# Patient Record
Sex: Male | Born: 1985
Health system: Southern US, Community
[De-identification: ages and names within clinical notes are randomized; demographics above are authoritative.]

## PROBLEM LIST (undated history)

## (undated) DIAGNOSIS — H409 Unspecified glaucoma: Secondary | ICD-10-CM

## (undated) DIAGNOSIS — I10 Essential (primary) hypertension: Secondary | ICD-10-CM

## (undated) DIAGNOSIS — F909 Attention-deficit hyperactivity disorder, unspecified type: Secondary | ICD-10-CM

## (undated) HISTORY — DX: Attention-deficit hyperactivity disorder, unspecified type: F90.9

## (undated) HISTORY — DX: Unspecified glaucoma: H40.9

## (undated) HISTORY — DX: Essential (primary) hypertension: I10

---
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
## 2012-10-12 ENCOUNTER — Encounter: Payer: Self-pay | Admitting: Family Medicine

## 2012-10-12 ENCOUNTER — Ambulatory Visit (INDEPENDENT_AMBULATORY_CARE_PROVIDER_SITE_OTHER): Payer: 59 | Admitting: Family Medicine

## 2012-10-12 VITALS — BP 104/70 | Temp 98.6°F | Wt 160.0 lb

## 2012-10-12 DIAGNOSIS — Z7689 Persons encountering health services in other specified circumstances: Secondary | ICD-10-CM

## 2012-10-12 DIAGNOSIS — Z23 Encounter for immunization: Secondary | ICD-10-CM

## 2012-10-12 DIAGNOSIS — Z7189 Other specified counseling: Secondary | ICD-10-CM

## 2012-10-12 DIAGNOSIS — Z0000 Encounter for general adult medical examination without abnormal findings: Secondary | ICD-10-CM

## 2012-10-12 DIAGNOSIS — F909 Attention-deficit hyperactivity disorder, unspecified type: Secondary | ICD-10-CM | POA: Insufficient documentation

## 2012-10-12 LAB — LIPID PANEL
LDL Cholesterol: 62 mg/dL (ref 0–99)
Total CHOL/HDL Ratio: 3
VLDL: 26.4 mg/dL (ref 0.0–40.0)

## 2012-10-12 MED ORDER — LISDEXAMFETAMINE DIMESYLATE 30 MG PO CAPS: 30.0000 mg | ORAL_CAPSULE | ORAL | Status: DC

## 2012-10-12 NOTE — Progress Notes (Signed)
Chief Complaint  Patient presents with  . Establish Care    HPI:  Luke Estrada is here to establish care. Needs physical for work and vaccines as has a new baby on the way. Needs doc for ADD management. Had lost several jobs related to poor focus then had formal dx ADD in late 20s. On ADD medication for some time - stable.  Has the following chronic problems and concerns today:  Patient Active Problem List   Diagnosis Date Noted  . ADHD (attention deficit hyperactivity disorder) 10/12/2012   Health Maintenance: -needs tdap and flu vaccine  ROS: See pertinent positives and negatives per HPI.  History reviewed. No pertinent past medical history.  History reviewed. No pertinent family history.  History   Social History  . Marital Status: Married    Spouse Name: N/A    Number of Children: N/A  . Years of Education: N/A   Social History Main Topics  . Smoking status: Never Smoker   . Smokeless tobacco: None  . Alcohol Use: Yes     Comment: under safe drinking levels  . Drug Use: None  . Sexual Activity: None   Other Topics Concern  . 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  GENERAL: vitals reviewed and listed above, alert, oriented, appears well hydrated and in no acute distress  HEENT: atraumatic, conjunttiva clear, no obvious abnormalities on inspection of external nose and ears  NECK: no obvious masses on inspection  LUNGS: clear to auscultation bilaterally, no wheezes, rales or rhonchi, good air  movement  CV: HRRR, no peripheral edema  MS: moves all extremities without noticeable abnormality  PSYCH: pleasant and cooperative, no obvious depression or anxiety  ASSESSMENT AND PLAN:  Discussed the following assessment and plan:  ADHD (attention deficit hyperactivity disorder) - Plan: lisdexamfetamine (VYVANSE) 30 MG capsule -refilled for one month to get to psych appt -advised do not do long term management of controlled substances in adults and will not provide further refills -brochure given for psychiatrist in town -advised regular exercise, healthy diet and counseling  Visit for preventive health examination - Plan: Lipid Panel, Hemoglobin A1c  Encounter to establish care -We reviewed the PMH, PSH, FH, SH, Meds and Allergies. -We provided refills for any medications we will prescribe as needed. -We addressed current concerns per orders and patient instructions. -We have asked for records for pertinent exams, studies, vaccines and notes from previous providers. -We have advised patient to follow up per instructions below. -reviewed level A and B USPSTF recs -tdap and flu vaccines given    -Patient advised to return or notify a doctor immediately if symptoms worsen or persist or new concerns arise.  Patient Instructions  -We have ordered labs or studies at this visit. It can take up to 1-2 weeks for results and processing. We will contact you with instructions IF your results are abnormal. Normal results  will be released to your [facility]. If you have not heard from [place] or can not find your results in [facility] in 2 weeks please contact our office.  -PLEASE SIGN UP FOR MYCHART TODAY   We recommend the following healthy lifestyle measures: - eat a healthy diet consisting of lots of vegetables, fruits, beans, nuts, seeds, healthy meats such as white chicken and fish and whole grains.  - avoid fried foods, fast food, processed foods, sodas, red meet and other fattening foods.   - get a least 150 minutes of aerobic exercise per week.   We refilled your ADD medication for 1 month - we do not do long term management of adult ADD and will not refill this medication after today.  Follow up in: 1 year or as needed      [name], [name].

## 2012-10-12 NOTE — Patient Instructions (Signed)
-  We have ordered labs or studies at this visit. It can take up to 1-2 weeks for results and processing. We will contact you with instructions IF your results are abnormal. Normal results will be released to your [facility]. If you have not heard from [place] or can not find your results in [facility] in 2 weeks please contact our office.  -PLEASE SIGN UP FOR MYCHART TODAY   We recommend the following healthy lifestyle measures: - eat a healthy diet consisting of lots of vegetables, fruits, beans, nuts, seeds, healthy meats such as white chicken and fish and whole grains.  - avoid fried foods, fast food, processed foods, sodas, red meet and other fattening foods.  - get a least 150 minutes of aerobic exercise per week.   We refilled your ADD medication for 1 month - we do not do long term management of adult ADD and will not refill this medication after today.  Follow up in: 1 year or as needed

## 2012-10-12 NOTE — Addendum Note (Signed)
Addended by: Azucena Freed on: 10/12/2012 01:32 PM   Modules accepted: Orders

## 2012-10-12 NOTE — Progress Notes (Signed)
Quick Note:  Called and spoke with pt and pt is aware. ______ 

## 2012-10-27 ENCOUNTER — Encounter: Payer: Self-pay | Admitting: Family Medicine

## 2012-11-12 ENCOUNTER — Encounter: Payer: Self-pay | Admitting: Family Medicine

## 2012-11-12 NOTE — Progress Notes (Signed)
Received a few OV notes from prior physician Dr. Darnelle Bos. Placed in scan box.

## 2017-01-10 ENCOUNTER — Encounter (HOSPITAL_COMMUNITY): Payer: Self-pay | Admitting: *Deleted

## 2017-01-10 ENCOUNTER — Other Ambulatory Visit: Payer: Self-pay

## 2017-01-10 ENCOUNTER — Emergency Department (HOSPITAL_COMMUNITY): Payer: 59

## 2017-01-10 ENCOUNTER — Emergency Department (HOSPITAL_COMMUNITY)
Admission: EM | Admit: 2017-01-10 | Discharge: 2017-01-11 | Disposition: A | Discharge status: Home / Self Care | Payer: 59 | Attending: Emergency Medicine | Admitting: Emergency Medicine

## 2017-01-10 DIAGNOSIS — Z79899 Other long term (current) drug therapy: Secondary | ICD-10-CM | POA: Insufficient documentation

## 2017-01-10 DIAGNOSIS — R1011 Right upper quadrant pain: Secondary | ICD-10-CM | POA: Insufficient documentation

## 2017-01-10 DIAGNOSIS — K8050 Calculus of bile duct without cholangitis or cholecystitis without obstruction: Secondary | ICD-10-CM

## 2017-01-10 LAB — COMPREHENSIVE METABOLIC PANEL
ALBUMIN: 5 g/dL (ref 3.5–5.0)
ALK PHOS: 75 U/L (ref 38–126)
ALT: 22 U/L (ref 17–63)
AST: 43 U/L — AB (ref 15–41)
Anion gap: 10 (ref 5–15)
BILIRUBIN TOTAL: 1 mg/dL (ref 0.3–1.2)
BUN: 10 mg/dL (ref 6–20)
CO2: 25 mmol/L (ref 22–32)
CREATININE: 0.89 mg/dL (ref 0.61–1.24)
Calcium: 8.9 mg/dL (ref 8.9–10.3)
Chloride: 103 mmol/L (ref 101–111)
GFR calc Af Amer: >60 mL/min (ref >60)
GLUCOSE: 122 mg/dL — AB (ref 65–99)
POTASSIUM: 3.8 mmol/L (ref 3.5–5.1)
Sodium: 138 mmol/L (ref 135–145)
TOTAL PROTEIN: 7.3 g/dL (ref 6.5–8.1)

## 2017-01-10 LAB — CBC
HEMATOCRIT: 44.1 % (ref 39.0–52.0)
Hemoglobin: 16.1 g/dL (ref 13.0–17.0)
MCH: 33.1 pg (ref 26.0–34.0)
MCHC: 36.5 g/dL — AB (ref 30.0–36.0)
MCV: 90.7 fL (ref 78.0–100.0)
PLATELETS: 276 10*3/uL (ref 150–400)
RBC: 4.86 MIL/uL (ref 4.22–5.81)
RDW: 12.9 % (ref 11.5–15.5)
WBC: 7.7 10*3/uL (ref 4.0–10.5)

## 2017-01-10 LAB — URINALYSIS, ROUTINE W REFLEX MICROSCOPIC
BILIRUBIN URINE: NEGATIVE
Glucose, UA: 150 mg/dL — AB
Ketones, ur: NEGATIVE mg/dL
LEUKOCYTES UA: NEGATIVE
NITRITE: NEGATIVE
PH: 5 (ref 5.0–8.0)
Protein, ur: NEGATIVE mg/dL
SPECIFIC GRAVITY, URINE: 1.026 (ref 1.005–1.030)
SQUAMOUS EPITHELIAL / LPF: NONE SEEN

## 2017-01-10 LAB — LIPASE, BLOOD: Lipase: 44 U/L (ref 11–51)

## 2017-01-10 MED ORDER — KETOROLAC TROMETHAMINE 30 MG/ML IJ SOLN
30.0000 mg | Freq: Once | INTRAMUSCULAR | Status: AC
  Administered 2017-01-10: 30 mg via INTRAMUSCULAR
  Filled 2017-01-10: qty 1

## 2017-01-10 NOTE — ED Provider Notes (Signed)
[facility]   CSN: [id number] Arrival date & time: 01/10/17  2053     History   Chief Complaint Chief Complaint  Patient presents with  . Abdominal Pain    HPI Luke Estrada is a 31 y.o. male.  HPI  31 y.o. male, presents to the Emergency Department today due to abdominal pain x 5 days. Notes dull ache that is worsened at night as well as with PO intake. No hx same. States pain 5/10. Located RUQ as well as epigastric region. Notes nausea without emesis. No diarrhea. BM today without difficulty. Denies melena or hematochezia. No CP/SOB. Noted abdominal surgeries include appendectomy. No fevers. No cough.congestion. No other symptoms noted   History reviewed. No pertinent past medical history.  Patient Active Problem List   Diagnosis Date Noted  . ADHD (attention deficit hyperactivity disorder) 10/12/2012    Past Surgical History:  Procedure Laterality Date  . APPENDECTOMY  2000       Home Medications    Prior to Admission medications   Medication Sig Start Date End Date Taking? Authorizing Provider  acetaminophen (TYLENOL) 325 MG tablet Take 325-650 mg by mouth every 6 (six) hours as needed (for muscle soreness).   Yes [provider]  lisdexamfetamine (VYVANSE) 40 MG capsule Take 40 mg by mouth every morning.   Yes [provider]  lisdexamfetamine (VYVANSE) 30 MG capsule Take 1 capsule (30 mg total) by mouth every morning. Patient not taking: Reported on 01/10/2017 10/12/12   Terressa KoyanagiKim, Hannah R, DO    Family History No family history on file.  Social History Social History   Tobacco Use  . Smoking status: Never Smoker  . Smokeless tobacco: Never Used  Substance Use Topics  . Alcohol use: Yes    Comment: under safe drinking levels  . Drug use: Not on file     Allergies   Patient has no known allergies.   Review of Systems Review of Systems ROS reviewed and all are negative for acute  change except as noted in the HPI.  Physical Exam Updated Vital Signs BP (!) 177/111 (BP Location: Right Arm)   Pulse 85   Temp 97.7 F (36.5 C) (Oral)   Resp 18   Ht 5\' 10"  (1.778 m)   Wt 76.2 kg (168 lb)   SpO2 99%   BMI 24.11 kg/m   Physical Exam  Constitutional: He is oriented to person, place, and time. He appears well-developed and well-nourished. No distress.  HENT:  Head: Normocephalic and atraumatic.  Right Ear: Tympanic membrane, external ear and ear canal normal.  Left Ear: Tympanic membrane, external ear and ear canal normal.  Nose: Nose normal.  Mouth/Throat: Uvula is midline, oropharynx is clear and moist and mucous membranes are normal. No trismus in the jaw. No oropharyngeal exudate, posterior oropharyngeal erythema or tonsillar abscesses.  Eyes: EOM are normal. Pupils are equal, round, and reactive to light.  Neck: Normal range of motion. Neck supple. No tracheal deviation present.  Cardiovascular: Normal rate, regular rhythm, S1 normal, S2 normal, normal heart sounds, intact distal pulses and normal pulses.  Pulmonary/Chest: Effort normal and breath sounds normal. No respiratory distress. He has no decreased breath sounds. He has no wheezes. He has no rhonchi. He has no rales.  Abdominal: Normal appearance and bowel sounds are normal. There is tenderness in the right upper quadrant. There is negative Murphy's sign.  Musculoskeletal: Normal range of motion.  Neurological: He is alert and  oriented to person, place, and time.  Skin: Skin is warm and dry.  Psychiatric: He has a normal mood and affect. His speech is normal and behavior is normal. Thought content normal.  Nursing note and vitals reviewed.    ED Treatments / Results  Labs (all labs ordered are listed, but only abnormal results are displayed) Labs Reviewed  COMPREHENSIVE METABOLIC PANEL - Abnormal; Notable for the following components:      Result Value   Glucose, Bld 122 (*)    AST 43 (*)    All  other components within normal limits  CBC - Abnormal; Notable for the following components:   MCHC 36.5 (*)    All other components within normal limits  URINALYSIS, ROUTINE W REFLEX MICROSCOPIC - Abnormal; Notable for the following components:   Glucose, UA 150 (*)    Hgb urine dipstick MODERATE (*)    Bacteria, UA RARE (*)    All other components within normal limits  LIPASE, BLOOD    EKG  EKG Interpretation None       Radiology US Abdomen Limited Ruq  Result Date: 01/11/2017 CLINICAL DATA:  Initial evaluation for acute right upper quadrant pain. EXAM: ULTRASOUND ABDOMEN LIMITED RIGHT UPPER QUADRANT COMPARISON:  None. FINDINGS: Gallbladder: Gallbladder somewhat contracted. 3 mm echogenic lesion adherent to the gallbladder wall most consistent with a small polyp. No stones or sludge. No free pericholecystic fluid. Gallbladder wall measure within normal limits at 2.5 mm. No free pericholecystic fluid. Common bile duct: Diameter: 2.1 mm Liver: No focal lesion identified. Within normal limits in parenchymal echogenicity. Portal vein is patent on color Doppler imaging with normal direction of blood flow towards the liver. IMPRESSION: 1. Negative ultrasound with no evidence for cholelithiasis, acute cholecystitis, or biliary dilatation. 2. Normal sonographic appearance of the liver. 3. Incidental 3 mm gallbladder polyp. Given size, this is felt to be of no clinical significance, with no follow-up imaging recommended. Electronically Signed   By: Rise Mu M.D.   On: 01/11/2017 00:33    Procedures Procedures (including critical care time)  Medications Ordered in ED Medications  ketorolac (TORADOL) 30 MG/ML injection 30 mg (30 mg Intramuscular Given 01/10/17 2333)     Initial Impression / Assessment and Plan / ED Course  I have reviewed the triage vital signs and the nursing notes.  Pertinent labs & imaging results that were available during my care of the patient were  reviewed by me and considered in my medical decision making (see chart for details).  Final Clinical Impressions(s) / ED Diagnoses  {I have reviewed and evaluated the relevant laboratory values. {I have reviewed and evaluated the relevant imaging studies.  {I have reviewed the relevant previous healthcare records.  {I obtained HPI from historian.   ED Course:  Assessment: Pt is a 31 y.o. male presents to the Emergency Department today due to abdominal pain x 5 days. Notes dull ache that is worsened at night as well as with PO intake. No hx same. States pain 5/10. Located RUQ as well as epigastric region. Notes nausea without emesis. No diarrhea. BM today without difficulty. Denies melena or hematochezia. No CP/SOB. Noted abdominal surgeries include appendectomy. No fevers. No cough.congestion. On exam, pt in NAD. Nontoxic/nonseptic appearing. VSS. Afebrile. Lungs CTA. Heart RRR. Abdomen nontender soft. Labs unremarkable. Lipase negative. No WBC. RUQ [place] without stones or sludge. Polpy noted. No cholecystitis. Given analgesia in ED. Plan is to DC home with follow up to surgery. Biliary colic? Meds given. Clsoe  follow up to Surgery. I have reviewed the [place] Controlled Sub[place] Reporting [place]. Given Rx percocet. At time of discharge, Patient is in no acute distress. Vital Signs are stable. Patient is able to ambulate. Patient able to tolerate PO.   Disposition/Plan:  DC Home Additional Verbal discharge instructions given and discussed with patient.  Pt Instructed to f/u with Surgery in the next week for evaluation and treatment of symptoms. Return precautions given Pt acknowledges and agrees with plan  Supervising Physician Dione BoozeGlick, David, MD  Final diagnoses:  RUQ pain  Biliary colic    ED Discharge Orders    None       Wilber BihariMohr, Antoine Fiallos, PA-C 01/11/17 0116    Dione BoozeGlick, David, MD 01/11/17 [phone]

## 2017-01-10 NOTE — ED Triage Notes (Signed)
The pt is c/o abd pain since 5 days no n v or diarrhea   Pain gets worse at night

## 2017-01-10 NOTE — ED Notes (Signed)
Pt. To ultrasound via stretcher

## 2017-01-11 MED ORDER — ONDANSETRON 4 MG PO TBDP: 4.0000 mg | ORAL_TABLET | Freq: Three times a day (TID) | ORAL | 0 refills | Status: DC | PRN

## 2017-01-11 MED ORDER — OXYCODONE-ACETAMINOPHEN 5-325 MG PO TABS: 1.0000 | ORAL_TABLET | Freq: Four times a day (QID) | ORAL | 0 refills | Status: DC | PRN

## 2017-01-11 NOTE — Discharge Instructions (Signed)
Please read and follow all provided instructions.  Your diagnoses today include:  1. Biliary colic   2. RUQ pain     Tests performed today include: Vital signs. See below for your results today.   Medications prescribed:  Take as prescribed   Home care instructions:  Follow any educational materials contained in this packet.  Follow-up instructions: Please follow-up with your General Surgery for further evaluation of symptoms and treatment   Return instructions:  Please return to the Emergency Department if you do not get better, if you get worse, or new symptoms OR  - Fever (temperature greater than 101.49F)  - Bleeding that does not stop with holding pressure to the area    -Severe pain (please note that you may be more sore the day after your accident)  - Chest Pain  - Difficulty breathing  - Severe nausea or vomiting  - Inability to tolerate food and liquids  - Passing out  - Skin becoming red around your wounds  - Change in mental status (confusion or lethargy)  - New numbness or weakness    Please return if you have any other emergent concerns.  Additional Information:  Your vital signs today were: BP (!) 149/105    Pulse 75    Temp 97.7 F (36.5 C) (Oral)    Resp 18    Ht 5\' 10"  (1.778 m)    Wt 76.2 kg (168 lb)    SpO2 97%    BMI 24.11 kg/m  If your blood pressure (BP) was elevated above 135/85 this visit, please have this repeated by your doctor within one month. ---------------

## 2017-04-26 DIAGNOSIS — H401331 Pigmentary glaucoma, bilateral, mild stage: Secondary | ICD-10-CM | POA: Insufficient documentation

## 2018-01-12 IMAGING — US US ABDOMEN LIMITED
1 series · 14 of 25 positions shown · non-contrast
Comparison: None.

CLINICAL DATA: Initial evaluation for acute right upper quadrant
pain.

EXAM:
ULTRASOUND ABDOMEN LIMITED RIGHT UPPER QUADRANT

[Series 1: us abdomen limited · 0.22mm/px · 14 of 56 slices shown]
[im 1/56]
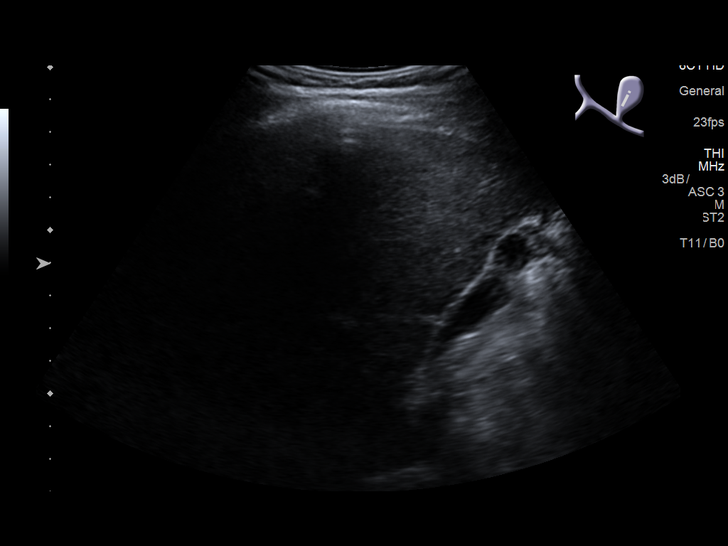
[im 5/56]
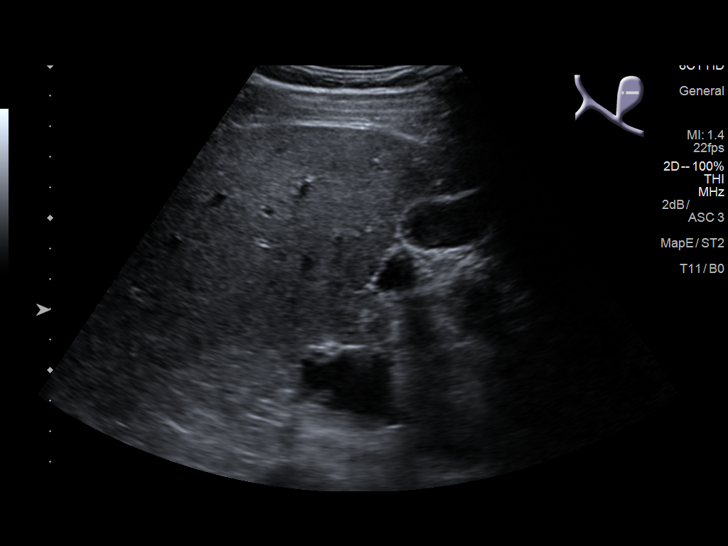
[im 10/56]
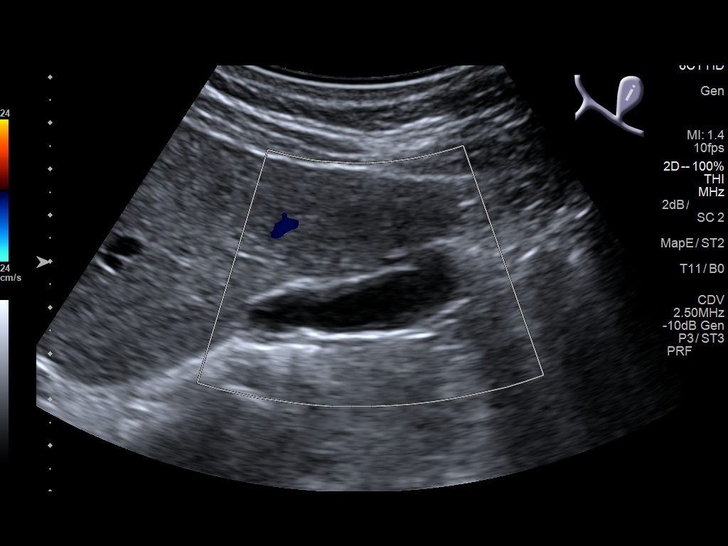
[im 14/56]
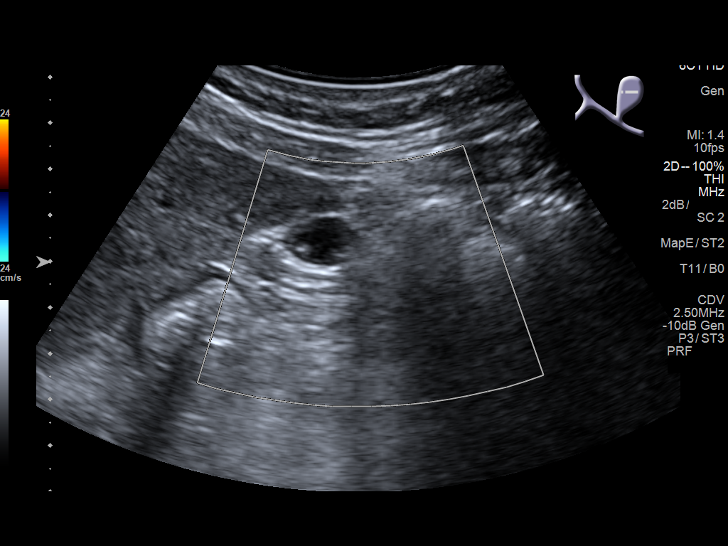
[im 19/56]
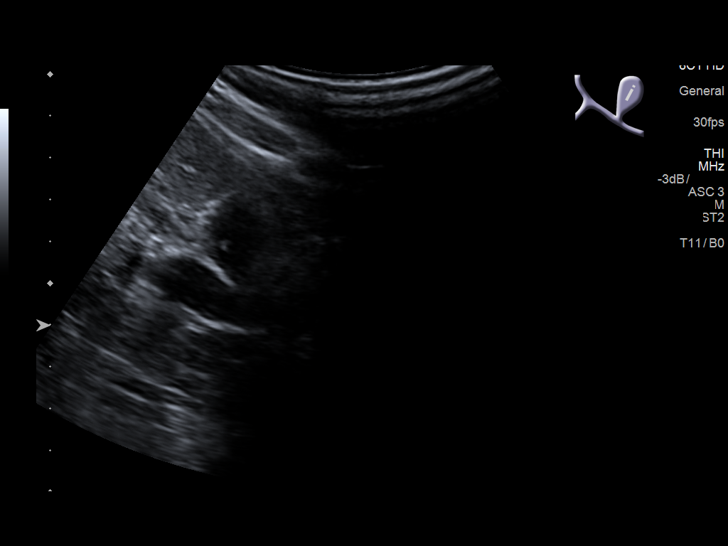
[im 21/56]
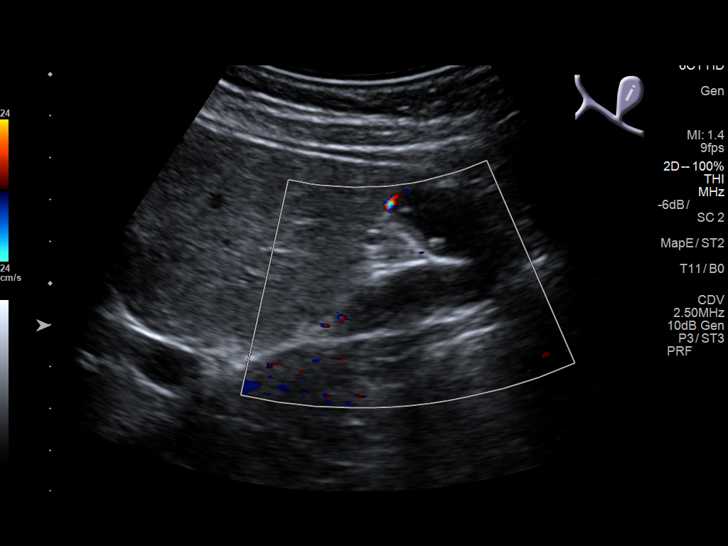
[im 26/56]
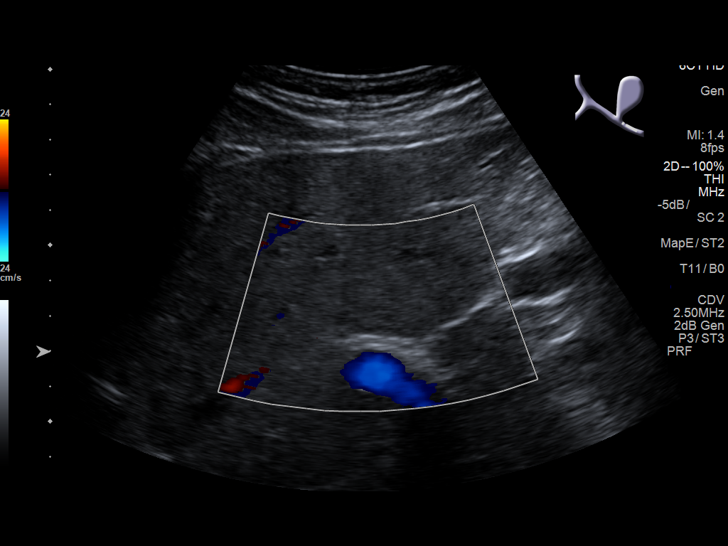
[im 30/56]
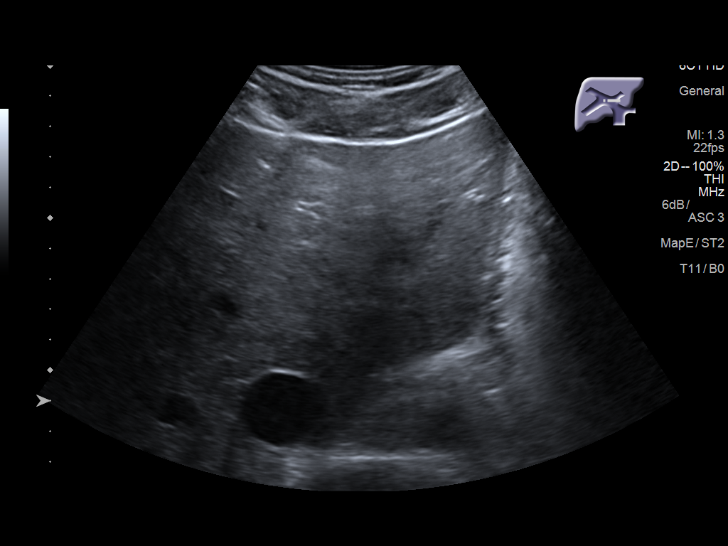
[im 35/56]
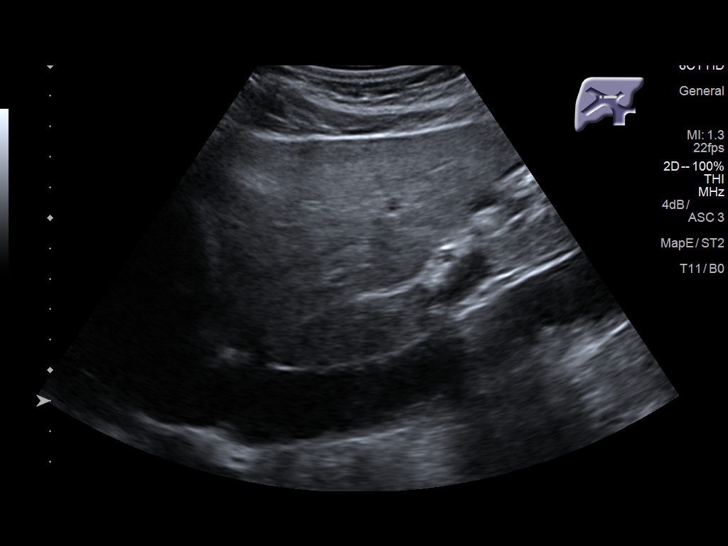
[im 37/56]
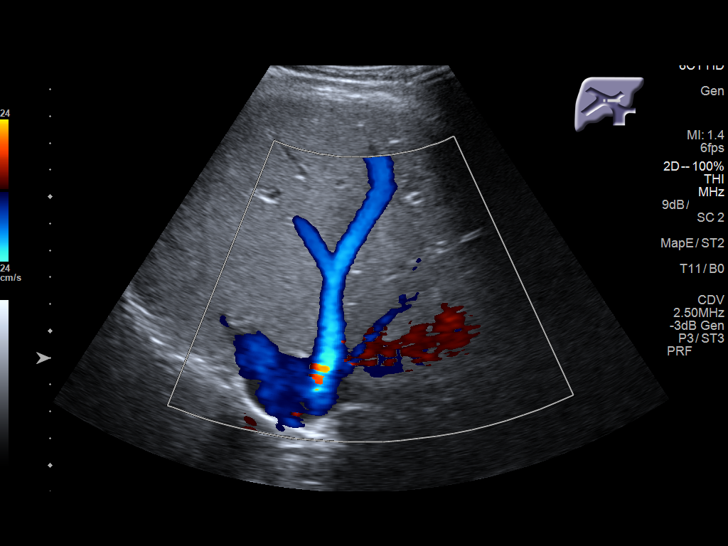
[im 42/56]
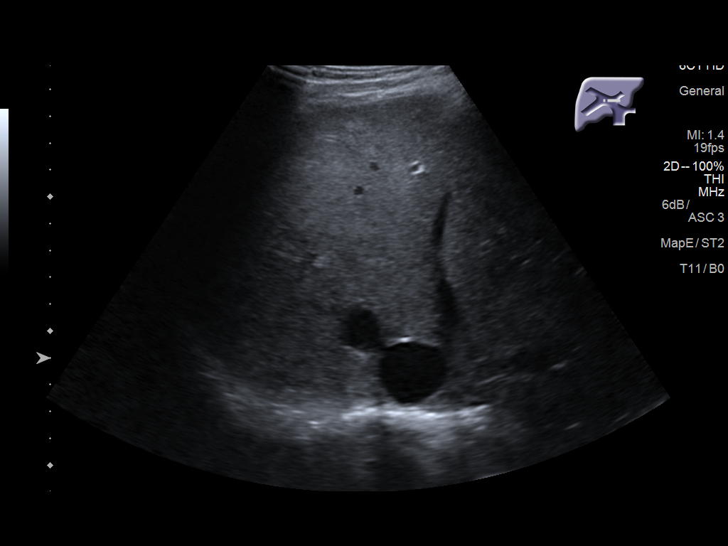
[im 46/56]
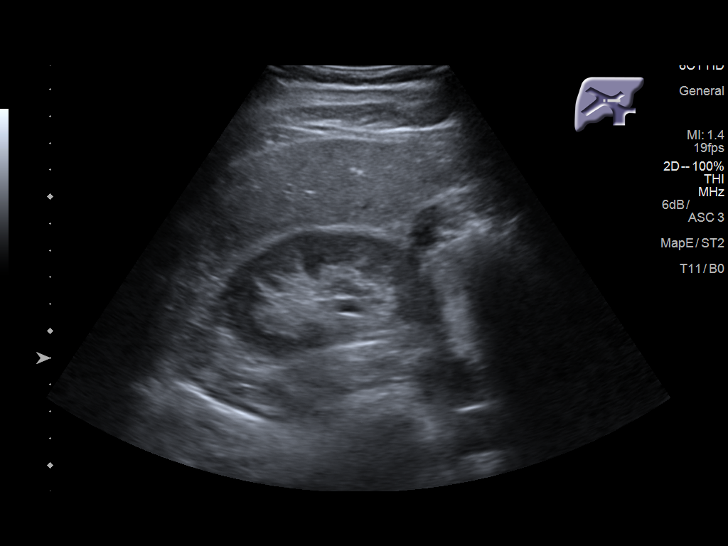
[im 51/56]
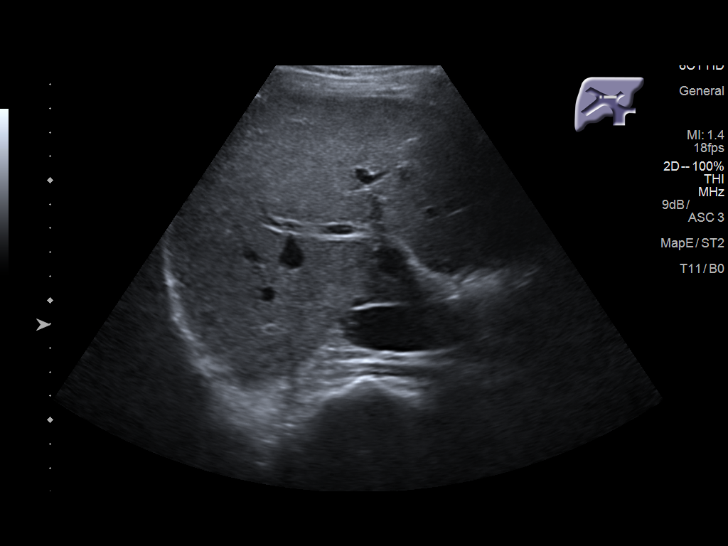
[im 56/56]
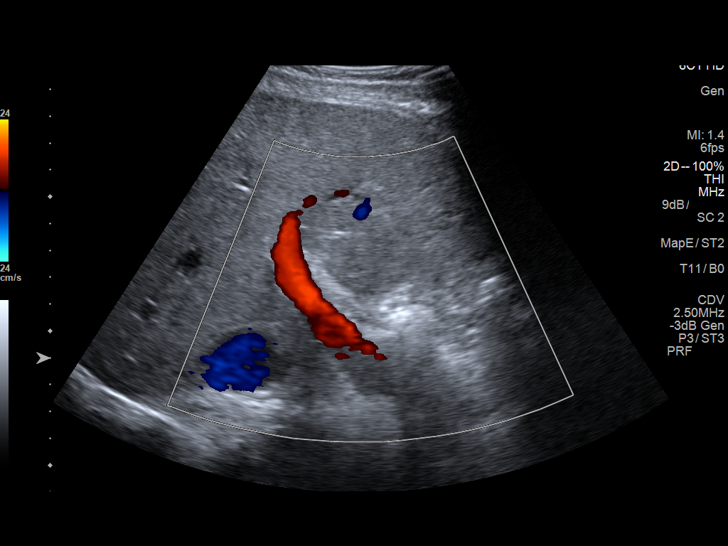

[14 of 25 positions shown; findings below may reference images not displayed]

FINDINGS: Gallbladder:

Gallbladder somewhat contracted. 3 mm echogenic lesion adherent to
the gallbladder wall most consistent with a small polyp. No stones
or sludge. No free pericholecystic fluid. Gallbladder wall measure
within normal limits at 2.5 mm. No free pericholecystic fluid.

Common bile duct:

Diameter: 2.1 mm

Liver:

No focal lesion identified. Within normal limits in parenchymal
echogenicity. Portal vein is patent on color Doppler imaging with
normal direction of blood flow towards the liver.
IMPRESSION: 1. Negative ultrasound with no evidence for cholelithiasis, acute
cholecystitis, or biliary dilatation.
2. Normal sonographic appearance of the liver.
3. Incidental 3 mm gallbladder polyp. Given size, this is felt to be
of no clinical significance, with no follow-up imaging recommended.

## 2018-12-04 ENCOUNTER — Encounter: Payer: Self-pay | Admitting: Psychiatry

## 2018-12-04 ENCOUNTER — Other Ambulatory Visit: Payer: Self-pay

## 2018-12-04 ENCOUNTER — Ambulatory Visit (INDEPENDENT_AMBULATORY_CARE_PROVIDER_SITE_OTHER): Payer: 59 | Admitting: Psychiatry

## 2018-12-04 DIAGNOSIS — F4323 Adjustment disorder with mixed anxiety and depressed mood: Secondary | ICD-10-CM

## 2018-12-04 NOTE — Progress Notes (Signed)
[facility] Counselor/Therapist Progress Note  Patient ID: Luke Estrada, MRN: [id number],    Date: 12/04/2018  Time Spent: 50 minutes   Treatment Type: Individual Therapy  Reported Symptoms: anxious, stress, sad  Mental Status Exam:  Appearance:   Casual     Behavior:  Appropriate  Motor:  Normal  Speech/Language:   Clear and Coherent  Affect:  Appropriate  Mood:  anxious and sad  Thought process:  normal  Thought content:    WNL  Sensory/Perceptual disturbances:    WNL  Orientation:  oriented to person, place, time/date and situation  Attention:  Good  Concentration:  Good  Memory:  WNL  Fund of knowledge:   Good  Insight:    Good  Judgment:   Good  Impulse Control:  Good   Risk Assessment: Danger to Self:  No Self-injurious Behavior: No Danger to Others: No Duty to Warn:no Physical Aggression / Violence:No  Access to Firearms a concern: No  Gang Involvement:No   Subjective: The client states that he saw his dad this past summer at a wedding in [place].  "Things went really well."  His wife recently gave birth to their second daughter 6 weeks ago.  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  "I did not have a peace about it or closure."  3 weeks later he got a contract position with a company in [place].  When he talked about taking a week unpaid paternity leave they ended his contract stating the project was over.  He recently was hired with another remote position with a company in [place].  He is concerned and stressed that he Luke Estrada lose this job as well although so far there are no problems. There have been other recent stressors that have bothered the client.  His oldest brother to whom he is close, has a volatile marriage.  He states they are close to divorce.  He has experienced 3 deaths over the last few weeks.  His  15 year old grandmother, his neighbor across the street who was murdered, and a friend in the [place] who after having twins stroked out and died.  He is unsure at what he should do to mourn the loss of these people.  His friend in the [place] who died of a stroke, I suggested he write a letter to her husband recounting his reflection of her as a way of processing his grief.  The client Luke Estrada consider that. I used eye-movement focusing on the client's stress about his job and the loss of his past jobs.  His negative thought is, "I cannot seem to have the motivation to get things done."  He feels the stress in his chest.  His subjective units of distress is a 7+.  As he processed he heard the words inside himself, "it is okay."  As we continue to process he related that this process is like prayer to him.  He walks and prays and words come to him.  Today it was, "I am with you."  The client also stated that he has noticed if he is interested in something he gets it done if not it seems to [name].  We discussed the concept of mood independent behavior.  This makes sense to the client.  As we continued to process  the thought came, "just do it."  I used the bilateral stimulation hand paddles with the client focusing on his to do list in the morning.  As he visualized that he invited Jesus into the picture.  The thought came to him "you are not broken."  He then went on to say, "I can do these things."  As we continued to process the client subjective units of distress was less than 2.  His positive cognition was, "I can do these things."  He feels more confident to be able to organize and get things going.  Interventions: Assertiveness/Communication, Mindfulness Meditation, Motivational Interviewing, Solution-Oriented/Positive Psychology, [name] Desensitization and Reprocessing (EMDR) and Insight-Oriented  Diagnosis:   ICD-10-CM   1. Adjustment disorder with mixed anxiety and  depressed mood  F43.23     Plan: Mindful prayer, mood independent behavior, assertiveness, boundaries, self-care, positive self talk.  Luke Estrada, [facility]

## 2019-01-01 ENCOUNTER — Encounter: Payer: Self-pay | Admitting: Psychiatry

## 2019-01-01 ENCOUNTER — Other Ambulatory Visit: Payer: Self-pay

## 2019-01-01 ENCOUNTER — Ambulatory Visit (INDEPENDENT_AMBULATORY_CARE_PROVIDER_SITE_OTHER): Payer: 59 | Admitting: Psychiatry

## 2019-01-01 DIAGNOSIS — F4321 Adjustment disorder with depressed mood: Secondary | ICD-10-CM | POA: Diagnosis not present

## 2019-01-01 NOTE — Progress Notes (Signed)
      [facility] Counselor/Therapist Progress Note  Patient ID: Luke Estrada, MRN: [id number],    Date: 01/01/2019  Time Spent: 45 minutes   Treatment Type: Individual Therapy  Reported Symptoms: irritability  Mental Status Exam:  Appearance:   Well Groomed     Behavior:  Appropriate  Motor:  Normal  Speech/Language:   Clear and Coherent  Affect:  Appropriate  Mood:  irritable  Thought process:  normal  Thought content:    WNL  Sensory/Perceptual disturbances:    WNL  Orientation:  oriented to person, place, time/date and situation  Attention:  Good  Concentration:  Good  Memory:  WNL  Fund of knowledge:   Good  Insight:    Good  Judgment:   Good  Impulse Control:  Good   Risk Assessment: Danger to Self:  No Self-injurious Behavior: No Danger to Others: No Duty to Warn:no Physical Aggression / Violence:No  Access to Firearms a concern: No  Gang Involvement:No   Subjective: The client states that he has been implementing what we have talked about from last session and saw an increase in his motivation.  Making a list really worked for him.  He was able to get a lot of different things done.  His job then assigned him a new project.  "I have let other things fall by the wayside."  The client realizes that he has let his wife down by not completing the things that she needs.  "I need to grow my sense of empathy and the needs of others."  We discussed what this felt like to the client.  He identified impatience and irritability when his agenda was challenged.  I gave the client information about the supplement l-theanine which could help reduce his irritability and stress.  I explained that clinically it does reduce that symptomology and gives him more margin.  The client agreed to explore that. The client has some frustration with his mother.  He recently got into a heated argument with her.  He is frustrated by her lack of patience, rudeness and talking over others.  The  client helps her on her website.  When he was trying to get their daughter down for a nap his mom texted him 10 times within an hour.  "I tried to explain to her why her behavior was wrong.  She kept deflecting and putting it back on me."  I went over the anger clarification model with the client.  We talked through how this could be applied to his mom.  He did not think that she would listen or change her behavior.  I suggested that the whole family which includes him and his wife along with his brother and his wife.  If all 4 of them expressed what was going on using the clarification model and asking for specific behavior changes.  I also encouraged him to make sure to include that they would intervene if she starts to repeat this pattern of behaviors.  He stated he would try that with his family.  Interventions: Assertiveness/Communication, Motivational Interviewing, Solution-Oriented/Positive Psychology and Insight-Oriented  Diagnosis:   ICD-10-CM   1. Adjustment disorder with depressed mood  F43.21     Plan: Use of the supplement L-theanine, assertiveness, boundaries, anger clarification model, family intervention with mom.  Venice Marcucci, [facility]

## 2019-01-29 ENCOUNTER — Ambulatory Visit: Payer: 59 | Admitting: Psychiatry

## 2019-02-10 ENCOUNTER — Other Ambulatory Visit: Payer: Self-pay

## 2019-02-10 ENCOUNTER — Ambulatory Visit (INDEPENDENT_AMBULATORY_CARE_PROVIDER_SITE_OTHER): Payer: 59 | Admitting: Psychiatry

## 2019-02-10 ENCOUNTER — Encounter: Payer: Self-pay | Admitting: Psychiatry

## 2019-02-10 DIAGNOSIS — F4321 Adjustment disorder with depressed mood: Secondary | ICD-10-CM

## 2019-02-10 NOTE — Progress Notes (Signed)
[facility] Counselor/Therapist Progress Note  Patient ID: Luke Estrada, MRN: [id number],    Date: 02/10/2019  Time Spent: 50 minutes   Treatment Type: Individual Therapy  Reported Symptoms: sadness, shame.  Mental Status Exam:  Appearance:   Casual     Behavior:  Appropriate  Motor:  Normal  Speech/Language:   Clear and Coherent  Affect:  Appropriate  Mood:  sad  Thought process:  normal  Thought content:    WNL  Sensory/Perceptual disturbances:    WNL  Orientation:  oriented to person, place, time/date and situation  Attention:  Good  Concentration:  Good  Memory:  WNL  Fund of knowledge:   Good  Insight:    Good  Judgment:   Good  Impulse Control:  Good   Risk Assessment: Danger to Self:  No Self-injurious Behavior: No Danger to Others: No Duty to Warn:no Physical Aggression / Violence:No  Access to Firearms a concern: No  Gang Involvement:No   Subjective: The client states that he had another interaction with his mother where she was very impatient.  Her website had gone down and she just needed to pay it for itr to start up again.  "She kept trying to contact me while I was putting my daughter down for bed."  The client was very annoyed with his mother's impatience.  When he did talk with her he set a firm boundaries which she kept trying to deflect.  He was able to communicate, "your actions have an impact."  When she would deflect the client stated he stopped her and reiterated what he said.  All of this happened right before Christmas.  He stated he talked with her again and affirmed that he loved her.  During Christmas she was so much more patient that everyone had a very pleasant time.  The client pointed out to his mother how great she did with being patient.  He stated she did not respond much but I affirmed to the client that this was the right way to approach things. Today the client states that he is concerned about all the jobs that he has had in the  past.  This has had a negative effect on his wife when he has been fired or let go from jobs.  When his wife gets very stressed she communicates that everything is on her and then refers back to his lost jobs.  We discussed the need for better communication in these circumstances.  I suggested to the client that he and his wife make a list of all the chores and then split them up in a way that seems fair.  The client believes he is doing everything he should but clearly his wife does not think so.  I explained that this would be one way to address that.  We also discussed forgiveness.  The client forgiving himself for his lost jobs.  Also the client asking for forgiveness from his wife for the impact losing his jobs had on her.  I instructed him to ask her what she needs to move past that.  He stated he would do this. The client also feels that his wife's family judges him because of his past lost jobs.  We discussed the issue with thinking errors.  I told the client that he does not know that they are judging him unless he asks.  I explained that mind-reading does not work.  He also needs to be careful not to interpret  people's behavior in a negative way.  I suggested that he make the assumption that all is well until he is told otherwise.  For the client, to feel better about himself he states he needs to get to the 2-year mark with this current job.  So far he has received great feedback from higher-ups in the company about his performance.  I used eye-movement with the client on the issues of work and extended family.  He was able to desensitize his anxiety and his positive cognition at the end of the session was, "I can do it."  Interventions: Assertiveness/Communication, Motivational Interviewing, Solution-Oriented/Positive Psychology, [name] Desensitization and Reprocessing (EMDR) and Insight-Oriented  Diagnosis:   ICD-10-CM   1. Adjustment disorder with depressed mood  F43.21     Plan: Chore  list, self forgiveness, forgiveness from wife, asking for what he wants, positive self talk, self-care, assertiveness, boundaries.  Gelene Mink Alannah Averhart, [facility]

## 2019-02-26 ENCOUNTER — Other Ambulatory Visit: Payer: Self-pay

## 2019-02-26 ENCOUNTER — Ambulatory Visit (INDEPENDENT_AMBULATORY_CARE_PROVIDER_SITE_OTHER): Payer: 59 | Admitting: Psychiatry

## 2019-02-26 ENCOUNTER — Encounter: Payer: Self-pay | Admitting: Psychiatry

## 2019-02-26 DIAGNOSIS — F4323 Adjustment disorder with mixed anxiety and depressed mood: Secondary | ICD-10-CM

## 2019-02-26 NOTE — Progress Notes (Signed)
[facility] Counselor/Therapist Progress Note  Patient ID: HONEST VANLEER, MRN: [id number],    Date: 02/26/2019  Time Spent: 50 minutes   Treatment Type: Individual Therapy  Reported Symptoms: anxious, sad  Mental Status Exam:  Appearance:   Well Groomed     Behavior:  Appropriate  Motor:  Normal  Speech/Language:   Clear and Coherent  Affect:  Appropriate  Mood:  anxious and sad  Thought process:  normal  Thought content:    WNL  Sensory/Perceptual disturbances:    WNL  Orientation:  oriented to person, place, time/date and situation  Attention:  Good  Concentration:  Good  Memory:  WNL  Fund of knowledge:   Good  Insight:    Good  Judgment:   Good  Impulse Control:  Good   Risk Assessment: Danger to Self:  No Self-injurious Behavior: No Danger to Others: No Duty to Warn:no Physical Aggression / Violence:No  Access to Firearms a concern: No  Gang Involvement:No   Subjective: The client states the last few weeks have been difficult.  His wife had kidney stone.  When they did a CT scan they found a mass on her colon.  The doctors told her that looked like it might be cancer.  The client was tore out of the frame about this.  She was scheduled for colonoscopy within the week.  After the procedure the doctor reported they had found no mass.  Client stated that before the procedure he could not talk to his wife about his fears.  It was all very difficult. The client feels that he has covered a lot of his issues with work, his mom, his dad, and his brothers.  Today he brought up a topic that he has never broached with anyone, his use of porn.  The client has a lot of shame with this.  Today we used eye-movement focusing on his porn use.  He feels shame and desperation inside of his chest.  His negative cognition is, "it seems impossible."  As the client processed his subjective units of distress which started at an 8 began to drop.  "I can do something if I put my mind to it."   He did have a subjective sense of feeling stuck.  We switched to the bilateral stimulation hand paddles.  The client states he looks at porn after his wife is gone to bed and he is alone downstairs.  As the client held the paddles I asked him to visualize himself in his living room with his laptop.  I then instructed the client to invite Jesus into that picture.  The client depends heavily on his faith so this made sense to him.  He was able to do that and felt relief as he noticed the forgiveness that he felt from [place].  "It can be done.  There is strength.  It is not futile."  As the client continue to process his subjective units of distress reduced to 2.  He felt that he still felt exposed for having disclose this to me.  I encouraged the client that this was a very strong positive step that he took.  I asked him to think about what the plan would look like to eliminate his porn use.  The client agreed that he needs to go to bed when his wife goes to bed and not be alone at night.  He also will look at finding scriptural versus to integrate into prayers that he  can pray.  I also asked the client to take it one day at a time.  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  His positive cognition at the end of the session was, "I can do this."  Interventions: Assertiveness/Communication, Motivational Interviewing, Solution-Oriented/Positive Psychology, [name] Desensitization and Reprocessing (EMDR) and Insight-Oriented  Diagnosis:   ICD-10-CM   1. Adjustment disorder with mixed anxiety and depressed mood  F43.23     Plan: Prayer, boundaries, assertiveness, 1 day at a time, self-care, exercise, plan to end porn use.  Gelene Mink Carsynn Bethune, [facility]

## 2019-03-24 ENCOUNTER — Other Ambulatory Visit: Payer: Self-pay

## 2019-03-24 ENCOUNTER — Encounter: Payer: Self-pay | Admitting: Psychiatry

## 2019-03-24 ENCOUNTER — Ambulatory Visit (INDEPENDENT_AMBULATORY_CARE_PROVIDER_SITE_OTHER): Payer: 59 | Admitting: Psychiatry

## 2019-03-24 DIAGNOSIS — F4323 Adjustment disorder with mixed anxiety and depressed mood: Secondary | ICD-10-CM | POA: Diagnosis not present

## 2019-03-24 NOTE — Progress Notes (Signed)
      [facility] Counselor/Therapist Progress Note  Patient ID: Luke Estrada, MRN: [id number],    Date: 03/24/2019  Time Spent: 50 minutes   Treatment Type: Individual Therapy  Reported Symptoms: sad, anxious  Mental Status Exam:  Appearance:   Well Groomed     Behavior:  Appropriate  Motor:  Normal  Speech/Language:   Clear and Coherent  Affect:  Appropriate  Mood:  anxious and sad  Thought process:  normal  Thought content:    WNL  Sensory/Perceptual disturbances:    WNL  Orientation:  oriented to person, place, time/date and situation  Attention:  Good  Concentration:  Good  Memory:  WNL  Fund of knowledge:   Good  Insight:    Good  Judgment:   Good  Impulse Control:  Good   Risk Assessment: Danger to Self:  No Self-injurious Behavior: No Danger to Others: No Duty to Warn:no Physical Aggression / Violence:No  Access to Firearms a concern: No  Gang Involvement:No   Subjective: Client states that he is doing fairly well since last session.  He has noted that through the pandemic there have been a lot of events that have taken place.  His wife has gotten pregnant which was a long time coming for them.  They have a new baby girl.  He has had a job transition.  Currently he feels like he is bored which has contributed to his porn use.  He and his wife have made a decision to travel with another family to the [place] this October.  In terms of his porn use he has made some progress.  He has found that praying for his wife in the morning has been a big help for him.  It helps him engage his wife more intentionally.  Today after much discussion he is decided that he will leave his laptop in his outside office.  "I needed away from me."  With it in the outside office he will have a easy access which she feels will be helpful.  He also has an accountability partner that he will be checking in with as well.  Since doing this he has seen how much he has grown in his relationship with  his wife so he is quite motivated to continue his process. Today we also used eye-movement to help integrate more positive cognitions around his improvement with his pornography use.  His positive cognition at the end of the session was, "my family is worth it."   Interventions: Assertiveness/Communication, Mindfulness Meditation, Motivational Interviewing, Solution-Oriented/Positive Psychology, [name] Desensitization and Reprocessing (EMDR) and Insight-Oriented  Diagnosis:   ICD-10-CM   1. Adjustment disorder with mixed anxiety and depressed mood  F43.23     Plan: Mood independent behavior, exercise, self-care, leave laptop in outside office, positive self talk, assertiveness, boundaries.  Gelene Mink Lecia Esperanza, [facility]

## 2019-04-28 ENCOUNTER — Ambulatory Visit: Payer: 59 | Admitting: Psychiatry

## 2019-06-22 ENCOUNTER — Other Ambulatory Visit: Payer: Self-pay

## 2019-06-22 ENCOUNTER — Encounter: Payer: Self-pay | Admitting: Psychiatry

## 2019-06-22 ENCOUNTER — Ambulatory Visit (INDEPENDENT_AMBULATORY_CARE_PROVIDER_SITE_OTHER): Payer: 59 | Admitting: Psychiatry

## 2019-06-22 DIAGNOSIS — F4322 Adjustment disorder with anxiety: Secondary | ICD-10-CM

## 2019-06-22 NOTE — Progress Notes (Signed)
[facility] Counselor/Therapist Progress Note  Patient ID: BRENYN PETREY, MRN: [id number],    Date: 06/22/2019  Time Spent: 50 minutes   Treatment Type: Individual Therapy  Reported Symptoms: anxiety  Mental Status Exam:  Appearance:   Casual     Behavior:  Appropriate  Motor:  Normal  Speech/Language:   Clear and Coherent  Affect:  Appropriate  Mood:  anxious  Thought process:  normal  Thought content:    WNL  Sensory/Perceptual disturbances:    WNL  Orientation:  oriented to person, place, time/date and situation  Attention:  Good  Concentration:  Good  Memory:  WNL  Fund of knowledge:   Good  Insight:    Good  Judgment:   Good  Impulse Control:  Good   Risk Assessment: Danger to Self:  No Self-injurious Behavior: No Danger to Others: No Duty to Warn:no Physical Aggression / Violence:No  Access to Firearms a concern: No  Gang Involvement:No   Subjective: The client reports that he is doing somewhat better with his porn use but still not as good as he would like.  I encouraged the client to pick himself up and dust himself off when he fails and put the next foot forward.  Every day is a new day with no mistakes.  The client appreciated this and agreed he could do that.  I also asked him to track each day that he does not use porn and marked off on his calendar.  The first goal would just be to make it through the day and then as he accumulated 7 days to work towards another 7. The client states today that his family is going on vacation with his wife's parents over the Fourth of July holiday.  They go to the beach house in [place], [place] which his wife's parents co-owned.  "It is always stressful.  We are guests and they tend not to say anything if there is any issue."  The client states he tries to help out in the kitchen but his mother-in-law will come back in and reload the dishwasher.  He states there is not a lot of communication so he is always walking  around on eggshells not knowing if he has done anything wrong.  I suggested to the client that he ask the mother-in-law the following question.  "How can we be most helpful?"  He can also ask his wife if there are things that they need to be doing.  He states she tells him that he should know but I countered that not all men know what to do.  I suggested to the client if he encounters that to say, "I just need some grace please tell me what we need to do."  He felt he could do that.  We also discussed that the client be more preemptive especially with his mother-in-law.  He thought this could work. The client also has a difficult time relating to his father-in-law.  He states that everyone sits in the living room looking at their devices.  "It is sad that her dad will not talk to me."  He does admit that his wife says her dad does not talk a lot.  I discussed the concept of radical acceptance and that his quietness probably has nothing to do with the client.  He has to give up seeking validation that he will never get.  I used eye-movement with the client focusing on this issue of his  in-laws on the vacation.  His negative cognition is, "I should not try so hard."  He feels loss in his chest.  His subjective units of distress is a 5.  As the client processed it clearly decreased to less than 1.  His positive cognition at the end of the session was, "I can accept this."  Interventions: Assertiveness/Communication, Motivational Interviewing, Solution-Oriented/Positive Psychology, [name] Desensitization and Reprocessing (EMDR) and Insight-Oriented  Diagnosis:   ICD-10-CM   1. Adjustment disorder with anxiety  F43.22     Plan: Radical acceptance, self-care, positive self talk, mood independent behavior, assertiveness, boundaries.  Gelene Mink Amatullah Christy, [facility]

## 2020-03-16 ENCOUNTER — Other Ambulatory Visit: Payer: Self-pay

## 2020-03-16 ENCOUNTER — Ambulatory Visit (INDEPENDENT_AMBULATORY_CARE_PROVIDER_SITE_OTHER): Payer: Managed Care, Other (non HMO) | Admitting: Internal Medicine

## 2020-03-16 ENCOUNTER — Encounter: Payer: Self-pay | Admitting: Internal Medicine

## 2020-03-16 ENCOUNTER — Encounter (INDEPENDENT_AMBULATORY_CARE_PROVIDER_SITE_OTHER): Payer: Self-pay

## 2020-03-16 VITALS — BP 144/94 | HR 98 | Temp 98.4°F | Ht 70.0 in | Wt 172.0 lb

## 2020-03-16 DIAGNOSIS — Z0000 Encounter for general adult medical examination without abnormal findings: Secondary | ICD-10-CM

## 2020-03-16 DIAGNOSIS — H409 Unspecified glaucoma: Secondary | ICD-10-CM

## 2020-03-16 DIAGNOSIS — Z23 Encounter for immunization: Secondary | ICD-10-CM | POA: Diagnosis not present

## 2020-03-16 DIAGNOSIS — R7989 Other specified abnormal findings of blood chemistry: Secondary | ICD-10-CM

## 2020-03-16 DIAGNOSIS — Z0001 Encounter for general adult medical examination with abnormal findings: Secondary | ICD-10-CM

## 2020-03-16 DIAGNOSIS — I10 Essential (primary) hypertension: Secondary | ICD-10-CM | POA: Diagnosis not present

## 2020-03-16 LAB — CBC WITH DIFFERENTIAL/PLATELET
Basophils Absolute: 0 10*3/uL (ref 0.0–0.1)
Basophils Relative: 0.3 % (ref 0.0–3.0)
Eosinophils Absolute: 0 10*3/uL (ref 0.0–0.7)
Eosinophils Relative: 0.8 % (ref 0.0–5.0)
HCT: 44 % (ref 39.0–52.0)
Hemoglobin: 15.2 g/dL (ref 13.0–17.0)
Lymphocytes Relative: 26.8 % (ref 12.0–46.0)
Lymphs Abs: 1.5 10*3/uL (ref 0.7–4.0)
MCHC: 34.5 g/dL (ref 30.0–36.0)
MCV: 94.2 fl (ref 78.0–100.0)
Monocytes Absolute: 0.5 10*3/uL (ref 0.1–1.0)
Monocytes Relative: 8.3 % (ref 3.0–12.0)
Neutro Abs: 3.5 10*3/uL (ref 1.4–7.7)
Neutrophils Relative %: 63.8 % (ref 43.0–77.0)
Platelets: 227 10*3/uL (ref 150.0–400.0)
RBC: 4.67 Mil/uL (ref 4.22–5.81)
RDW: 13.8 % (ref 11.5–15.5)
WBC: 5.5 10*3/uL (ref 4.0–10.5)

## 2020-03-16 LAB — URINALYSIS, ROUTINE W REFLEX MICROSCOPIC
Bilirubin Urine: NEGATIVE
Hgb urine dipstick: NEGATIVE
Ketones, ur: NEGATIVE
Leukocytes,Ua: NEGATIVE
Nitrite: NEGATIVE
Specific Gravity, Urine: 1.025 (ref 1.000–1.030)
Total Protein, Urine: NEGATIVE
Urine Glucose: NEGATIVE
Urobilinogen, UA: 0.2 (ref 0.0–1.0)
pH: 7.5 (ref 5.0–8.0)

## 2020-03-16 LAB — HEPATIC FUNCTION PANEL
ALT: 17 U/L (ref 0–53)
AST: 19 U/L (ref 0–37)
Albumin: 4.7 g/dL (ref 3.5–5.2)
Alkaline Phosphatase: 80 U/L (ref 39–117)
Bilirubin, Direct: 0.1 mg/dL (ref 0.0–0.3)
Total Bilirubin: 0.7 mg/dL (ref 0.2–1.2)
Total Protein: 7.5 g/dL (ref 6.0–8.3)

## 2020-03-16 LAB — LIPID PANEL
Cholesterol: 159 mg/dL (ref 0–200)
HDL: 30.7 mg/dL — ABNORMAL LOW (ref >39.00)
LDL Cholesterol: 94 mg/dL (ref 0–99)
NonHDL: 128.52
Total CHOL/HDL Ratio: 5
Triglycerides: 173 mg/dL — ABNORMAL HIGH (ref 0.0–149.0)
VLDL: 34.6 mg/dL (ref 0.0–40.0)

## 2020-03-16 LAB — BASIC METABOLIC PANEL
BUN: 18 mg/dL (ref 6–23)
CO2: 28 mEq/L (ref 19–32)
Calcium: 9.4 mg/dL (ref 8.4–10.5)
Chloride: 106 mEq/L (ref 96–112)
Creatinine, Ser: 0.95 mg/dL (ref 0.40–1.50)
GFR: 102.56 mL/min (ref >60.00)
Glucose, Bld: 96 mg/dL (ref 70–99)
Potassium: 4.4 mEq/L (ref 3.5–5.1)
Sodium: 141 mEq/L (ref 135–145)

## 2020-03-16 LAB — PROTIME-INR
INR: 1.1 ratio — ABNORMAL HIGH (ref 0.8–1.0)
Prothrombin Time: 12.3 s (ref 9.6–13.1)

## 2020-03-16 LAB — TSH: TSH: 1.57 u[IU]/mL (ref 0.35–4.50)

## 2020-03-16 NOTE — Patient Instructions (Signed)

## 2020-03-16 NOTE — Progress Notes (Signed)
Subjective:  Patient ID: Luke Estrada, male    DOB: 1985/02/12  Age: 35 y.o. MRN: [id number]  CC: Annual Exam and Hypertension  This visit occurred during the SARS-CoV-2 public health emergency.  Safety protocols were in place, including screening questions prior to the visit, additional usage of staff PPE, and extensive cleaning of exam room while observing appropriate contact time as indicated for disinfecting solutions.    HPI Luke Estrada presents for a CPX.  He runs 4 miles a day.  He does not experience CP, DOE, palpitations, edema, or fatigue.  He has a history of hypertension but is not currently taking an hypertensive.  He has a history of elevated liver enzymes.  He only drinks 1 alcoholic beverage a day.  Past Medical History:  Diagnosis Date   Glaucoma    Hypertension    Past Surgical History:  Procedure Laterality Date   APPENDECTOMY  2000    reports that he has never smoked. He has never used smokeless tobacco. He reports current alcohol use. No history on file for drug use. family history includes Celiac disease in his brother; Hypercholesterolemia in his mother; Hypertension in his father and mother. No Known Allergies  Outpatient Medications Prior to Visit  Medication Sig Dispense Refill   lisdexamfetamine (VYVANSE) 60 MG capsule Take 60 mg by mouth every morning.     acetaminophen (TYLENOL) 325 MG tablet Take 325-650 mg by mouth every 6 (six) hours as needed (for muscle soreness).     lisdexamfetamine (VYVANSE) 30 MG capsule Take 1 capsule (30 mg total) by mouth every morning. 30 capsule 0   lisdexamfetamine (VYVANSE) 40 MG capsule Take 40 mg by mouth every morning.     ondansetron (ZOFRAN ODT) 4 MG disintegrating tablet Take 1 tablet (4 mg total) by mouth every 8 (eight) hours as needed for nausea or vomiting. 20 tablet 0   oxyCODONE-acetaminophen (PERCOCET/ROXICET) 5-325 MG tablet Take 1 tablet by mouth every 6 (six) hours as needed for severe pain. 10  tablet 0   No facility-administered medications prior to visit.    ROS Review of Systems  Constitutional: Negative.  Negative for appetite change, diaphoresis, fatigue and unexpected weight change.  HENT: Negative.   Eyes: Negative for visual disturbance.  Respiratory: Negative for cough, chest tightness, shortness of breath and wheezing.   Cardiovascular: Negative for chest pain, palpitations and leg swelling.  Gastrointestinal: Negative for abdominal pain.  Endocrine: Negative.   Genitourinary: Negative.  Negative for difficulty urinating, hematuria, scrotal swelling and testicular pain.  Musculoskeletal: Negative.   Skin: Negative.   Neurological: Negative.  Negative for dizziness, seizures and weakness.  Hematological: Negative for adenopathy. Does not bruise/bleed easily.  Psychiatric/Behavioral: Negative.     Objective:  BP (!) 144/94    Pulse 98    Temp 98.4 F (36.9 C) (Oral)    Ht 5\' 10"  (1.778 m)    Wt 172 lb (78 kg)    SpO2 99%    BMI 24.68 kg/m   BP Readings from Last 3 Encounters:  03/16/20 (!) 144/94  01/10/17 (!) 149/105  10/12/12 104/70    Wt Readings from Last 3 Encounters:  03/16/20 172 lb (78 kg)  01/10/17 168 lb (76.2 kg)  10/12/12 160 lb (72.6 kg)    Physical Exam Vitals reviewed.  Constitutional:      Appearance: Normal appearance.  HENT:     Nose: Nose normal.     Mouth/Throat:     Mouth: Mucous membranes are  moist.  Eyes:     General: No scleral icterus.    Conjunctiva/sclera: Conjunctivae normal.  Cardiovascular:     Rate and Rhythm: Normal rate and regular rhythm.     Heart sounds: Normal heart sounds, S1 normal and S2 normal. No murmur heard. No friction rub. No gallop.      Comments: EKG- NSR, 92 bpm Normal EKG Pulmonary:     Effort: Pulmonary effort is normal.     Breath sounds: No stridor. No wheezing, rhonchi or rales.  Abdominal:     General: Abdomen is flat. Bowel sounds are normal. There is no distension.     Palpations:  Abdomen is soft. There is no hepatomegaly, splenomegaly or mass.     Tenderness: There is no abdominal tenderness.     Hernia: No hernia is present.  Musculoskeletal:     Cervical back: Neck supple.     Right lower leg: No edema.     Left lower leg: No edema.  Lymphadenopathy:     Cervical: No cervical adenopathy.  Skin:    General: Skin is warm and dry.     Findings: No rash.  Neurological:     General: No focal deficit present.     Mental Status: He is alert.  Psychiatric:        Mood and Affect: Mood normal.        Behavior: Behavior normal.     Lab Results  Component Value Date   WBC 5.5 03/16/2020   HGB 15.2 03/16/2020   HCT 44.0 03/16/2020   PLT 227.0 03/16/2020   GLUCOSE 96 03/16/2020   CHOL 159 03/16/2020   TRIG 173.0 (H) 03/16/2020   HDL 30.70 (L) 03/16/2020   LDLCALC 94 03/16/2020   ALT 17 03/16/2020   AST 19 03/16/2020   NA 141 03/16/2020   K 4.4 03/16/2020   CL 106 03/16/2020   CREATININE 0.95 03/16/2020   BUN 18 03/16/2020   CO2 28 03/16/2020   TSH 1.57 03/16/2020   INR 1.1 (H) 03/16/2020   HGBA1C 5.3 10/12/2012    US Abdomen Limited RUQ  Result Date: 01/11/2017 CLINICAL DATA:  Initial evaluation for acute right upper quadrant pain. EXAM: ULTRASOUND ABDOMEN LIMITED RIGHT UPPER QUADRANT COMPARISON:  None. FINDINGS: Gallbladder: Gallbladder somewhat contracted. 3 mm echogenic lesion adherent to the gallbladder wall most consistent with a small polyp. No stones or sludge. No free pericholecystic fluid. Gallbladder wall measure within normal limits at 2.5 mm. No free pericholecystic fluid. Common bile duct: Diameter: 2.1 mm Liver: No focal lesion identified. Within normal limits in parenchymal echogenicity. Portal vein is patent on color Doppler imaging with normal direction of blood flow towards the liver. IMPRESSION: 1. Negative ultrasound with no evidence for cholelithiasis, acute cholecystitis, or biliary dilatation. 2. Normal sonographic appearance of the  liver. 3. Incidental 3 mm gallbladder polyp. Given size, this is felt to be of no clinical significance, with no follow-up imaging recommended. Electronically Signed   By: Rise Mu M.D.   On: 01/11/2017 00:33    Assessment & Plan:   Luke Estrada was seen today for annual exam and hypertension.  Diagnoses and all orders for this visit:  Encounter for general adult medical examination with abnormal findings- Exam completed, labs reviewed, vaccines reviewed, no cancer screenings are indicated, patient education was given. -     Lipid panel; Future -     Hepatitis C antibody; Future -     HIV Antibody (routine testing w rflx); Future -  HIV Antibody (routine testing w rflx) -     Hepatitis C antibody -     Lipid panel  Elevated LFTs- His LFTs are normal now.  I will screen him for viral hepatitis. -     Hepatic function panel; Future -     Cancel: Hepatitis C antibody; Future -     Hepatitis B core antibody, total; Future -     Hepatitis A antibody, total; Future -     Cancel: HIV Antibody (routine testing w rflx); Future -     Hepatitis B surface antibody,quantitative; Future -     Hepatitis B surface antigen; Future -     Protime-INR; Future -     Protime-INR -     Hepatitis B surface antigen -     Hepatitis B surface antibody,quantitative -     Hepatitis A antibody, total -     Hepatitis B core antibody, total -     Hepatic function panel  Hypertension, unspecified type- He has stage II hypertension.  His EKG is normal.  I will screen him for secondary causes and endorgan damage.  At this time he is not willing to take an antihypertensive.  I have asked him to come back in 6 months to recheck his blood pressure and to reconsider. -     EKG 12-Lead -     Aldosterone + renin activity w/ ratio; Future -     CBC with Differential/Platelet; Future -     TSH; Future -     Urinalysis, Routine w reflex microscopic; Future -     Basic metabolic panel; Future -     Basic metabolic  panel -     Urinalysis, Routine w reflex microscopic -     TSH -     CBC with Differential/Platelet -     Aldosterone + renin activity w/ ratio  Glaucoma of both eyes, unspecified glaucoma type  Other orders -     Flu Vaccine QUAD 6+ mos PF IM (Fluarix Quad PF)   I have discontinued Franky Macho D. Vidaurri's acetaminophen, ondansetron, and oxyCODONE-acetaminophen. I am also having him maintain his lisdexamfetamine.  No orders of the defined types were placed in this encounter.    Follow-up: Return in about 3 months (around 06/13/2020).  Sanda Linger, MD

## 2020-03-17 ENCOUNTER — Encounter: Payer: Self-pay | Admitting: Internal Medicine

## 2020-03-17 DIAGNOSIS — I10 Essential (primary) hypertension: Secondary | ICD-10-CM | POA: Insufficient documentation

## 2020-03-17 DIAGNOSIS — H409 Unspecified glaucoma: Secondary | ICD-10-CM | POA: Insufficient documentation

## 2020-03-24 LAB — ALDOSTERONE + RENIN ACTIVITY W/ RATIO
ALDO / PRA Ratio: 8.6 Ratio (ref 0.9–28.9)
Aldosterone: 5 ng/dL
Renin Activity: 0.58 ng/mL/h (ref 0.25–5.82)

## 2020-03-24 LAB — HEPATITIS A ANTIBODY, TOTAL: Hepatitis A AB,Total: REACTIVE — AB

## 2020-03-24 LAB — HEPATITIS B SURFACE ANTIGEN: Hepatitis B Surface Ag: NONREACTIVE

## 2020-03-24 LAB — HEPATITIS B CORE ANTIBODY, TOTAL: Hep B Core Total Ab: NONREACTIVE

## 2020-03-24 LAB — HEPATITIS C ANTIBODY
Hepatitis C Ab: NONREACTIVE
SIGNAL TO CUT-OFF: 0 (ref <1.00)

## 2020-03-24 LAB — HEPATITIS B SURFACE ANTIBODY, QUANTITATIVE: Hepatitis B-Post: 26 m[IU]/mL (ref >=10)

## 2020-03-24 LAB — HIV ANTIBODY (ROUTINE TESTING W REFLEX): HIV 1&2 Ab, 4th Generation: NONREACTIVE

## 2020-03-26 ENCOUNTER — Encounter: Payer: Self-pay | Admitting: Internal Medicine

## 2020-05-16 ENCOUNTER — Other Ambulatory Visit: Payer: Self-pay

## 2020-05-16 ENCOUNTER — Encounter: Payer: Self-pay | Admitting: Behavioral Health

## 2020-05-16 ENCOUNTER — Ambulatory Visit (INDEPENDENT_AMBULATORY_CARE_PROVIDER_SITE_OTHER): Payer: 59 | Admitting: Behavioral Health

## 2020-05-16 ENCOUNTER — Other Ambulatory Visit: Payer: Self-pay | Admitting: Behavioral Health

## 2020-05-16 VITALS — BP 154/106 | HR 88 | Ht 71.0 in | Wt 169.0 lb

## 2020-05-16 DIAGNOSIS — F900 Attention-deficit hyperactivity disorder, predominantly inattentive type: Secondary | ICD-10-CM | POA: Diagnosis not present

## 2020-05-16 MED ORDER — LISDEXAMFETAMINE DIMESYLATE 60 MG PO CAPS: 60.0000 mg | ORAL_CAPSULE | ORAL | 0 refills | Status: DC

## 2020-05-16 NOTE — Telephone Encounter (Signed)
Please review pharmacy note

## 2020-05-16 NOTE — Progress Notes (Signed)
[facility] MD/PA/NP Initial Note  05/16/2020 10:04 AM Luke Estrada  MRN:  [id number]  Chief Complaint:  Chief Complaint    ADHD; Establish Care      HPI: 35 year old male presents to this office for initial visit to establish care. He says, "I was a recent patient at another practice but was having difficulty with scheduling. I needed someone to manage my ADHD medication". He says that he will have records forwarded to this practice. He says that he is doing great mentally and physically. He says that he is happy with the current dose of Vyvanse 60 mg and that medication works well for him. Pt said he was first diagnosed with ADHD at age of 44 after losing several jobs due to undiagnosed attention problems. He reports no anxiety, depression or mood problems today. Says that he sleeps 7-8 hours per night. Reports that he runs several days per week and is prepping for marathon. Says he and his spouse moved to [place] approximately 8 years ago and he resides here with their two children.   Past Psychiatric Medication Failures: Adderall Ritalin    Visit Diagnosis:    ICD-10-CM   1. Attention deficit hyperactivity disorder (ADHD), predominantly inattentive type  F90.0 lisdexamfetamine (VYVANSE) 60 MG capsule    Past Psychiatric History: none noted  Past Medical History:  Past Medical History:  Diagnosis Date  . Glaucoma   . Hypertension     Past Surgical History:  Procedure Laterality Date  . APPENDECTOMY  2000    Family Psychiatric History: none  Family History:  Family History  Problem Relation Age of Onset  . Hypertension Mother   . Hypercholesterolemia Mother   . Hypertension Father   . Celiac disease Brother     Social History:  Social History   Socioeconomic History  . Marital status: Married    Spouse name: Not on file  . Number of children: 2  . Years of education: 16  . Highest education level: Bachelor's degree (e.g., BA, AB, BS)  Occupational History   . Occupation: Armed forces logistics/support/administrative officer  Tobacco Use  . Smoking status: Never Smoker  . Smokeless tobacco: Never Used  Vaping Use  . Vaping Use: Never used  Substance and Sexual Activity  . Alcohol use: Yes    Alcohol/week: 1.0 standard drink    Types: 1 Cans of beer per week    Comment: under safe drinking levels  . Drug use: Never  . Sexual activity: Yes    Partners: Female    Comment: married  Other Topics Concern  . Not on file  Social History Narrative   Work or School: beer and wine sales      Home Situation: lives with wife and two daughters      Spiritual Beliefs: Christian      Lifestyle: Runs most day. Prepping for a marathon.               Social Determinants of Health   Financial Resource Strain: Not on file  Food Insecurity: Not on file  Transportation Needs: Not on file  Physical Activity: Not on file  Stress: Not on file  Social Connections: Not on file    Allergies: No Known Allergies  Metabolic Disorder Labs: Lab Results  Component Value Date   HGBA1C 5.3 10/12/2012   No results found for: PROLACTIN Lab Results  Component Value Date   CHOL 159 03/16/2020   TRIG 173.0 (H) 03/16/2020   HDL 30.70 (L) 03/16/2020  CHOLHDL 5 03/16/2020   VLDL 34.6 03/16/2020   LDLCALC 94 03/16/2020   LDLCALC 62 10/12/2012   Lab Results  Component Value Date   TSH 1.57 03/16/2020    Therapeutic Level Labs: No results found for: LITHIUM No results found for: VALPROATE No components found for:  CBMZ  Current Medications: Current Outpatient Medications  Medication Sig Dispense Refill  . lisdexamfetamine (VYVANSE) 60 MG capsule Take 1 capsule (60 mg total) by mouth every morning. 1 capsule 0  . lisdexamfetamine (VYVANSE) 60 MG capsule Take 60 mg by mouth every morning.     No current facility-administered medications for this visit.    Medication Side Effects: none  Orders placed this visit:  No orders of the defined types were placed in this  encounter.   Psychiatric Specialty Exam:  Review of Systems  Constitutional: Negative.   Respiratory: Negative.   Cardiovascular: Negative.   Musculoskeletal: Negative for gait problem.  Allergic/Immunologic: Negative.   Neurological: Negative for tremors.    Blood pressure (!) 154/106, pulse 88, height 5\' 11"  (1.803 m), weight 169 lb (76.7 kg).Body mass index is 23.57 kg/m.  General Appearance: Casual, Neat and Well Groomed  Eye Contact:  Good  Speech:  Clear and Coherent  Volume:  Normal  Mood:  NA  Affect:  Appropriate  Thought Process:  Coherent  Orientation:  Full (Time, Place, and Person)  Thought Content: Logical   Suicidal Thoughts:  No  Homicidal Thoughts:  No  Memory:  WNL  Judgement:  Good  Insight:  Good  Psychomotor Activity:  Normal  Concentration:  Concentration: Good  Recall:  Good  Fund of Knowledge: Good  Language: Good  Assets:  Desire for Improvement  ADL's:  Intact  Cognition: WNL  Prognosis:  Good   Screenings:  PHQ2-9   Flowsheet Row Office Visit from 03/16/2020 in [facility]  PHQ-2 Total Score 0      Receiving Psychotherapy: No   Treatment Plan/Recommendations: Will continue with Vyvanse 60 mg daily Will report worsening condition prompty To follow up with PCP for Cholesterol and heart health regularly Will monitor BP and HR on regular basis Will follow up in 4 weeks to reassess     [facility], NP

## 2020-05-16 NOTE — Telephone Encounter (Signed)
Ok I changed the quantity but you will have to hit the approve all button since it is a controlled substance

## 2020-05-16 NOTE — Telephone Encounter (Signed)
Yes, 30. My mistake

## 2020-06-13 ENCOUNTER — Ambulatory Visit: Payer: 59 | Admitting: Behavioral Health

## 2020-07-10 ENCOUNTER — Other Ambulatory Visit: Payer: Self-pay | Admitting: Behavioral Health

## 2020-07-10 DIAGNOSIS — F900 Attention-deficit hyperactivity disorder, predominantly inattentive type: Secondary | ICD-10-CM

## 2020-07-10 NOTE — Telephone Encounter (Signed)
Last filled 5/20

## 2020-08-10 ENCOUNTER — Other Ambulatory Visit: Payer: Self-pay | Admitting: Behavioral Health

## 2020-08-10 DIAGNOSIS — F900 Attention-deficit hyperactivity disorder, predominantly inattentive type: Secondary | ICD-10-CM

## 2020-08-10 NOTE — Telephone Encounter (Signed)
Last filled 6/20

## 2020-09-12 ENCOUNTER — Other Ambulatory Visit: Payer: Self-pay | Admitting: Behavioral Health

## 2020-09-12 DIAGNOSIS — F900 Attention-deficit hyperactivity disorder, predominantly inattentive type: Secondary | ICD-10-CM

## 2020-09-13 NOTE — Telephone Encounter (Signed)
Kazuto called to request refill of his vyvanse.  Made appt for 9/8.  Send to [facility]

## 2020-09-14 NOTE — Telephone Encounter (Signed)
Last filled 7/21

## 2020-09-28 ENCOUNTER — Other Ambulatory Visit: Payer: Self-pay

## 2020-09-28 ENCOUNTER — Ambulatory Visit (INDEPENDENT_AMBULATORY_CARE_PROVIDER_SITE_OTHER): Payer: 59 | Admitting: Behavioral Health

## 2020-09-28 ENCOUNTER — Encounter: Payer: Self-pay | Admitting: Behavioral Health

## 2020-09-28 DIAGNOSIS — F900 Attention-deficit hyperactivity disorder, predominantly inattentive type: Secondary | ICD-10-CM

## 2020-09-28 DIAGNOSIS — F4322 Adjustment disorder with anxiety: Secondary | ICD-10-CM

## 2020-09-28 MED ORDER — LISDEXAMFETAMINE DIMESYLATE 60 MG PO CAPS: 60.0000 mg | ORAL_CAPSULE | Freq: Every morning | ORAL | 0 refills | Status: DC

## 2020-09-28 NOTE — Progress Notes (Deleted)
[facility] Med Check  Patient ID: Luke Estrada,  MRN: [id number]  PCP: Etta Grandchild, MD  Date of Evaluation: 09/28/2020 Time spent:30 minutes  Chief Complaint:   HISTORY/CURRENT STATUS: HPI   Individual Medical History/ Review of Systems: Changes? :No   Allergies: Patient has no known allergies.  Current Medications:  Current Outpatient Medications:    lisdexamfetamine (VYVANSE) 60 MG capsule, Take 60 mg by mouth every morning., Disp: , Rfl:    [START ON 10/14/2020] lisdexamfetamine (VYVANSE) 60 MG capsule, Take 1 capsule (60 mg total) by mouth every morning., Disp: 90 capsule, Rfl: 0 Medication Side Effects: none  Family Medical/ Social History: Changes?    MENTAL HEALTH EXAM:  There were no vitals taken for this visit.There is no height or weight on file to calculate BMI.  General Appearance: Casual  Eye Contact:  Good  Speech:  Clear and Coherent  Volume:  {PSY:22686}  Mood:  {PSY:22306}  Affect:  {PSY:[id number]}  Thought Process:  {PSY:22688}  Orientation:  {PSY:22689}  Thought Content: {PSYt:22690}   Suicidal Thoughts:  {PSY:22692}  Homicidal Thoughts:  {PSY:22692}  Memory:  {PSY:[id number]}  Judgement:  {PSY:22694}  Insight:  {PSY:22695}  Psychomotor Activity:  {PSY:22696}  Concentration:  {PSY:21399}  Recall:  {PSY:22877}  Fund of Knowledge: {PSY:22877}  Language: {FTD:32202}  Assets:  {PSY:22698}  ADL's:  {PSY:22290}  Cognition: {PSY:[id number]}  Prognosis:  {PSY:22877}    DIAGNOSES:    ICD-10-CM   1. Attention deficit hyperactivity disorder (ADHD), predominantly inattentive type  F90.0 lisdexamfetamine (VYVANSE) 60 MG capsule      Receiving Psychotherapy: {RKY:70623}   RECOMMENDATIONS: ***   Joan Flores, NP

## 2020-09-28 NOTE — Progress Notes (Signed)
[facility] Med [facility]  Patient ID: STYLES FAMBRO,  MRN: [id number]  PCP: Etta Grandchild, MD  Date of Evaluation: 09/28/2020 Time spent:30 minutes  Chief Complaint:  Chief Complaint   Anxiety; ADHD; Follow-up; Medication Refill; Family Problem     HISTORY/CURRENT STATUS: HPI 35 year old male presents to this office for follow up and medication refill. He says that Vyvanse continues to work well for his attention and focus problems with ADHD. He did express concerns for his poor relationship with his brother who he feels is having some mental illness. He just needs refills today because he is doing great. No medication changes are indicated at this time. Anxiety 1/10 and depression is 0/10. Sleeping 7-8 hours per night. No mania, or psychosis. No SI/HI.   Past Psychiatric Medication Failures: Adderall Ritalin       Individual Medical History/ Review of Systems: Changes? :No   Allergies: Patient has no known allergies.  Current Medications:  Current Outpatient Medications:    lisdexamfetamine (VYVANSE) 60 MG capsule, Take 60 mg by mouth every morning., Disp: , Rfl:    [START ON 10/14/2020] lisdexamfetamine (VYVANSE) 60 MG capsule, Take 1 capsule (60 mg total) by mouth every morning., Disp: 90 capsule, Rfl: 0 Medication Side Effects: none  Family Medical/ Social History: Changes? No  MENTAL HEALTH EXAM:  There were no vitals taken for this visit.There is no height or weight on file to calculate BMI.  General Appearance: Casual, Neat, and Well Groomed  Eye Contact:  Good  Speech:  Clear and Coherent  Volume:  Normal  Mood:  NA  Affect:  Appropriate  Thought Process:  Coherent  Orientation:  Full (Time, Place, and Person)  Thought Content: Logical   Suicidal Thoughts:  No  Homicidal Thoughts:  No  Memory:  WNL  Judgement:  Good  Insight:  Good  Psychomotor Activity:  NA  Concentration:  Concentration: Good  Recall:  Good  Fund of Knowledge: Good  Language: Good   Assets:  Desire for Improvement  ADL's:  Intact  Cognition: WNL  Prognosis:  Good    DIAGNOSES:    ICD-10-CM   1. Adjustment disorder with anxiety  F43.22     2. Attention deficit hyperactivity disorder (ADHD), predominantly inattentive type  F90.0 lisdexamfetamine (VYVANSE) 60 MG capsule      Receiving Psychotherapy: No    RECOMMENDATIONS:   Treatment Plan/Recommendations: Will continue with Vyvanse 60 mg daily Will report worsening condition prompty To follow up with PCP for Cholesterol and heart health regularly Will monitor BP and HR on regular basis Will follow up in 3 months to reassess  Reviewed PDMP  Joan Flores, NP

## 2020-11-09 ENCOUNTER — Other Ambulatory Visit: Payer: Self-pay

## 2020-11-09 ENCOUNTER — Telehealth: Payer: Self-pay | Admitting: Behavioral Health

## 2020-11-09 MED ORDER — LISDEXAMFETAMINE DIMESYLATE 60 MG PO CAPS: 60.0000 mg | ORAL_CAPSULE | ORAL | 0 refills | Status: DC

## 2020-11-09 NOTE — Telephone Encounter (Signed)
Please send 30 day supply to [facility],pended

## 2020-11-09 NOTE — Telephone Encounter (Signed)
Please cancel vyvanse at [facility]

## 2020-11-09 NOTE — Telephone Encounter (Signed)
[facility] and cancelled #90 Rx for Vyvanse.

## 2020-11-09 NOTE — Telephone Encounter (Signed)
Pt called and said his insurance will only pay for a 30 day supply of his vyvanse. Please cancel the one on file and send one for 30 to [facility]

## 2020-11-13 ENCOUNTER — Telehealth: Payer: Self-pay | Admitting: Behavioral Health

## 2020-11-13 NOTE — Telephone Encounter (Signed)
Informed pt rx is at pharmacy

## 2020-11-13 NOTE — Telephone Encounter (Signed)
Patient rtc to office for an update. Says pharmacy has not received prescription. Ph: [phone]. Pharmacy [facility] [address]

## 2020-12-11 ENCOUNTER — Telehealth: Payer: Self-pay | Admitting: Behavioral Health

## 2020-12-11 ENCOUNTER — Other Ambulatory Visit: Payer: Self-pay

## 2020-12-11 MED ORDER — LISDEXAMFETAMINE DIMESYLATE 60 MG PO CAPS: 60.0000 mg | ORAL_CAPSULE | ORAL | 0 refills | Status: DC

## 2020-12-11 NOTE — Telephone Encounter (Signed)
Next visit is 12/28/20. Requesting refill on Vyvanse 60 mg called to:  [facility] [place], [place] - [address] [facility] [place]  Phone:  [phone]  Fax:  [phone]

## 2020-12-11 NOTE — Telephone Encounter (Signed)
Pended.

## 2020-12-28 ENCOUNTER — Ambulatory Visit (INDEPENDENT_AMBULATORY_CARE_PROVIDER_SITE_OTHER): Payer: 59 | Admitting: Behavioral Health

## 2020-12-28 ENCOUNTER — Encounter: Payer: Self-pay | Admitting: Behavioral Health

## 2020-12-28 ENCOUNTER — Other Ambulatory Visit: Payer: Self-pay

## 2020-12-28 DIAGNOSIS — F4322 Adjustment disorder with anxiety: Secondary | ICD-10-CM | POA: Diagnosis not present

## 2020-12-28 DIAGNOSIS — F900 Attention-deficit hyperactivity disorder, predominantly inattentive type: Secondary | ICD-10-CM | POA: Diagnosis not present

## 2020-12-28 MED ORDER — LISDEXAMFETAMINE DIMESYLATE 60 MG PO CAPS: 60.0000 mg | ORAL_CAPSULE | ORAL | 0 refills | Status: DC

## 2020-12-28 NOTE — Addendum Note (Signed)
Addended by: Avelina Laine A on: 12/28/2020 09:41 AM   Modules accepted: Orders

## 2020-12-28 NOTE — Progress Notes (Signed)
[facility]  Patient ID: Luke Estrada,  MRN: [id number]  PCP: Etta Grandchild, MD  Date of Evaluation: 12/28/2020 Time spent:30 minutes  Chief Complaint:  Chief Complaint   ADHD; Follow-up; Medication Refill     HISTORY/CURRENT STATUS: HPI 35 year old male presents to this office for follow up and medication refill. He says that Vyvanse continues to work well for his attention and focus problems with ADHD. He says that his family life is going well. He just needs refills today because he is doing great. No medication changes are indicated at this time. Anxiety 1/10 and depression is 0/10. Sleeping 7-8 hours per night. No mania, or psychosis. No SI/HI.    Past Psychiatric Medication Failures: Adderall Ritalin   Individual Medical History/ Review of Systems: Changes? :No   Allergies: Patient has no known allergies.  Current Medications:  Current Outpatient Medications:    lisdexamfetamine (VYVANSE) 60 MG capsule, Take 1 capsule (60 mg total) by mouth every morning., Disp: 90 capsule, Rfl: 0   [START ON 01/10/2021] lisdexamfetamine (VYVANSE) 60 MG capsule, Take 1 capsule (60 mg total) by mouth every morning., Disp: 30 capsule, Rfl: 0 Medication Side Effects: anxiety  Family Medical/ Social History: Changes? No  MENTAL HEALTH EXAM:  There were no vitals taken for this visit.There is no height or weight on file to calculate BMI.  General Appearance: Casual and Neat  Eye Contact:  Good  Speech:  Clear and Coherent  Volume:  Normal  Mood:  NA  Affect:  Appropriate  Thought Process:  Coherent  Orientation:  Full (Time, Place, and Person)  Thought Content: Logical   Suicidal Thoughts:  No  Homicidal Thoughts:  No  Memory:  WNL  Judgement:  Good  Insight:  Good  Psychomotor Activity:  Normal  Concentration:  Concentration: Good  Recall:  Good  Fund of Knowledge: Good  Language: Good  Assets:  Desire for Improvement  ADL's:  Intact  Cognition: WNL   Prognosis:  Good    DIAGNOSES:    ICD-10-CM   1. Attention deficit hyperactivity disorder (ADHD), predominantly inattentive type  F90.0 lisdexamfetamine (VYVANSE) 60 MG capsule    2. Adjustment disorder with anxiety  F43.22       Receiving Psychotherapy: No    RECOMMENDATIONS:   Treatment Plan/Recommendations: Will continue with Vyvanse 60 mg daily Will report worsening condition prompty To follow up with PCP for Cholesterol and heart health regularly Will monitor BP and HR on regular basis Will follow up in 3 months to reassess  Reviewed PDMP       Joan Flores, NP

## 2021-03-06 ENCOUNTER — Other Ambulatory Visit: Payer: Self-pay | Admitting: Behavioral Health

## 2021-03-06 DIAGNOSIS — F900 Attention-deficit hyperactivity disorder, predominantly inattentive type: Secondary | ICD-10-CM

## 2021-03-06 NOTE — Telephone Encounter (Signed)
Next visit is 03/28/21. Requesting refill on Vyvanse called to:  [facility] [place], [place] - [address] [facility] [place]  Phone:  [phone]  Fax:  [phone]

## 2021-03-07 NOTE — Telephone Encounter (Signed)
Filled 1/20 due 2/17

## 2021-03-08 MED ORDER — LISDEXAMFETAMINE DIMESYLATE 60 MG PO CAPS: 60.0000 mg | ORAL_CAPSULE | ORAL | 0 refills | Status: DC

## 2021-03-28 ENCOUNTER — Ambulatory Visit: Payer: 59 | Admitting: Behavioral Health

## 2021-04-02 ENCOUNTER — Telehealth: Payer: Self-pay | Admitting: Behavioral Health

## 2021-04-02 NOTE — Telephone Encounter (Signed)
Filled 2/17 due 3/17

## 2021-04-02 NOTE — Telephone Encounter (Signed)
Pt called and made a f/up appt with Luke Estrada for 3/17. HE requested a RF of Vyvanse be sent to [facility]. ?

## 2021-04-06 ENCOUNTER — Encounter: Payer: Self-pay | Admitting: Behavioral Health

## 2021-04-06 ENCOUNTER — Ambulatory Visit (INDEPENDENT_AMBULATORY_CARE_PROVIDER_SITE_OTHER): Payer: BC Managed Care – PPO | Admitting: Behavioral Health

## 2021-04-06 ENCOUNTER — Other Ambulatory Visit: Payer: Self-pay

## 2021-04-06 ENCOUNTER — Other Ambulatory Visit: Payer: Self-pay | Admitting: Behavioral Health

## 2021-04-06 VITALS — BP 139/103 | HR 83 | Ht 71.0 in | Wt 152.0 lb

## 2021-04-06 DIAGNOSIS — F900 Attention-deficit hyperactivity disorder, predominantly inattentive type: Secondary | ICD-10-CM

## 2021-04-06 DIAGNOSIS — F4322 Adjustment disorder with anxiety: Secondary | ICD-10-CM | POA: Diagnosis not present

## 2021-04-06 NOTE — Progress Notes (Signed)
[facility] ? ?Patient ID: Luke Estrada,  ?MRN: [id number] ? ?PCP: Janith Lima, MD ? ?Date of Evaluation: 04/06/2021 ?Time spent:30 minutes ? ?Chief Complaint:  ?Chief Complaint   ?ADHD; Follow-up; Medication Refill; Medication Problem; Patient Education ?  ? ? ?HISTORY/CURRENT STATUS: ?HPI ? ?36 year old male presents to this office for follow up and medication refill. He says that Vyvanse continues to work well for his attention and focus problems with ADHD. He does says that he feels like he needs increased dose on week day but understands he is near max dose. He says that his family life is going well. He just got promoted on his job. No medication changes are indicated at this time. Anxiety 1/10 and depression is 0/10. Sleeping 7-8 hours per night. No mania, or psychosis. No SI/HI.  ?  ?Past Psychiatric Medication Failures: ?Adderall ?Ritalin  ? ? ?Individual Medical History/ Review of Systems: Changes? :No  ? ?Allergies: Patient has no known allergies. ? ?Current Medications:  ?Current Outpatient Medications:  ?  lisdexamfetamine (VYVANSE) 60 MG capsule, Take 1 capsule (60 mg total) by mouth every morning., Disp: 90 capsule, Rfl: 0 ?  lisdexamfetamine (VYVANSE) 60 MG capsule, Take 1 capsule (60 mg total) by mouth every morning., Disp: 30 capsule, Rfl: 0 ?  lisdexamfetamine (VYVANSE) 60 MG capsule, Take 1 capsule (60 mg total) by mouth every morning., Disp: 30 capsule, Rfl: 0 ?Medication Side Effects: none ? ?Family Medical/ Social History: Changes? No ? ?MENTAL HEALTH EXAM: ? ?Blood pressure (!) 139/103, pulse 83, height 5\' 11"  (1.803 m), weight 152 lb (68.9 kg).Body mass index is 21.2 kg/m?.  ?General Appearance: Casual and Neat  ?Eye Contact:  Good  ?Speech:  Clear and Coherent  ?Volume:  Normal  ?Mood:  Anxious  ?Affect:  Appropriate  ?Thought Process:  Coherent  ?Orientation:  Full (Time, Place, and Person)  ?Thought Content: Logical   ?Suicidal Thoughts:  No  ?Homicidal Thoughts:  No   ?Memory:  WNL  ?Judgement:  Good  ?Insight:  Good  ?Psychomotor Activity:  Normal  ?Concentration:  Concentration: Good  ?Recall:  Good  ?Fund of Knowledge: Good  ?Language: Good  ?Assets:  Desire for Improvement  ?ADL's:  Intact  ?Cognition: WNL  ?Prognosis:  Good  ? ? ?DIAGNOSES:  ?  ICD-10-CM   ?1. Attention deficit hyperactivity disorder (ADHD), predominantly inattentive type  F90.0   ?  ?2. Adjustment disorder with anxiety  F43.22   ?  ? ? ?Receiving Psychotherapy: No  ? ? ?RECOMMENDATIONS:  ? ?Greater than 50% of  30 min face to face time with patient was spent on counseling and coordination of care. We discussed concerns with Vyvanse not being as effective. Explained that we were near max script with what I could write. He understood and did not want to make any other changes. We discussed concerns over his continued elevated BP this visit.  Recommended he follow up with his PCP promptly. ? ?Will continue with Vyvanse 60 mg daily ?Will report worsening condition prompty ?To follow up with PCP for Cholesterol and heart health regularly ?Will monitor BP and HR on regular basis ?Will follow up in 3 months to reassess  ?Discussed potential benefits, risks, and side effects of stimulants with patient to include increased heart rate, palpitations, insomnia, increased anxiety, increased irritability, or decreased appetite.  Instructed patient to contact office if experiencing any significant tolerability issues.  ?Reviewed PDMP ? ? ? ? ?Elwanda Brooklyn, NP  ?

## 2021-05-06 ENCOUNTER — Other Ambulatory Visit: Payer: Self-pay | Admitting: Behavioral Health

## 2021-05-06 DIAGNOSIS — F900 Attention-deficit hyperactivity disorder, predominantly inattentive type: Secondary | ICD-10-CM

## 2021-05-08 NOTE — Telephone Encounter (Signed)
Luke Estrada called to check on the status of his Vyvanse prescription.  Has appt 07/06/21.  Please send to [facility]. ?

## 2021-06-11 ENCOUNTER — Other Ambulatory Visit: Payer: Self-pay | Admitting: Behavioral Health

## 2021-06-11 DIAGNOSIS — F900 Attention-deficit hyperactivity disorder, predominantly inattentive type: Secondary | ICD-10-CM

## 2021-07-06 ENCOUNTER — Encounter: Payer: Self-pay | Admitting: Behavioral Health

## 2021-07-06 ENCOUNTER — Ambulatory Visit (INDEPENDENT_AMBULATORY_CARE_PROVIDER_SITE_OTHER): Payer: BC Managed Care – PPO | Admitting: Behavioral Health

## 2021-07-06 DIAGNOSIS — F900 Attention-deficit hyperactivity disorder, predominantly inattentive type: Secondary | ICD-10-CM

## 2021-07-06 MED ORDER — LISDEXAMFETAMINE DIMESYLATE 60 MG PO CAPS: 60.0000 mg | ORAL_CAPSULE | Freq: Every morning | ORAL | 0 refills | Status: DC

## 2021-07-06 NOTE — Progress Notes (Signed)
[facility]  Patient ID: Luke Estrada,  MRN: [id number]  PCP: Etta Grandchild, MD  Date of Evaluation: 07/06/2021 Time spent:20 minutes  Chief Complaint:  Chief Complaint   ADHD; Follow-up; Medication Refill; Medication Problem     HISTORY/CURRENT STATUS: HPI 36 year old male presents to this office for follow up and medication refill. No changes this visit. He says that Vyvanse continues to work well for his attention and focus problems with ADHD. He does says that he feels like he needs increased dose on week day but understands he is near max dose. He says that his family life is going well. He just got promoted on his job. No medication changes are indicated at this time. Anxiety 1/10 and depression is 0/10. Sleeping 7-8 hours per night. No mania, or psychosis. No SI/HI.    Past Psychiatric Medication Failures: Adderall Ritalin       Individual Medical History/ Review of Systems: Changes? :No   Allergies: Patient has no known allergies.  Current Medications:  Current Outpatient Medications:    lisdexamfetamine (VYVANSE) 60 MG capsule, Take 1 capsule (60 mg total) by mouth every morning., Disp: 90 capsule, Rfl: 0   lisdexamfetamine (VYVANSE) 60 MG capsule, Take 1 capsule (60 mg total) by mouth every morning., Disp: 30 capsule, Rfl: 0   lisdexamfetamine (VYVANSE) 60 MG capsule, Take 1 capsule (60 mg total) by mouth every morning., Disp: 30 capsule, Rfl: 0 Medication Side Effects: none  Family Medical/ Social History: Changes? No  MENTAL HEALTH EXAM:  There were no vitals taken for this visit.There is no height or weight on file to calculate BMI.  General Appearance: Casual, Neat, and Well Groomed  Eye Contact:  Good  Speech:  Clear and Coherent  Volume:  Normal  Mood:  NA  Affect:  Appropriate  Thought Process:  Coherent  Orientation:  Full (Time, Place, and Person)  Thought Content: Logical   Suicidal Thoughts:  No  Homicidal Thoughts:  No   Memory:  WNL  Judgement:  Good  Insight:  Good  Psychomotor Activity:  Normal  Concentration:  Concentration: Good  Recall:  Good  Fund of Knowledge: Good  Language: Good  Assets:  Desire for Improvement  ADL's:  Intact  Cognition: WNL  Prognosis:  Good    DIAGNOSES:    ICD-10-CM   1. Attention deficit hyperactivity disorder (ADHD), predominantly inattentive type  F90.0 lisdexamfetamine (VYVANSE) 60 MG capsule      Receiving Psychotherapy: No    RECOMMENDATIONS:   Greater than 50% of  30 min face to face time with patient was spent on counseling and coordination of care. We reviewed his medications. He will be traveling out of [place] this week and will be running out of his Vyvanse. He is requesting early fill. Sent special request to pharmacy. Not concerned in misuse of medication. He understood and did not want to make any other changes. We discussed concerns over his continued elevated BP this visit.  Recommended he follow up with his PCP promptly.   Will continue with Vyvanse 60 mg daily Will report worsening condition prompty To follow up with PCP for Cholesterol and heart health regularly Will monitor BP and HR on regular basis Will follow up in 3 months to reassess  Discussed potential benefits, risks, and side effects of stimulants with patient to include increased heart rate, palpitations, insomnia, increased anxiety, increased irritability, or decreased appetite.  Instructed patient to contact office if experiencing any significant tolerability issues.  Reviewed PDMP           Joan Flores, NP

## 2021-08-06 ENCOUNTER — Other Ambulatory Visit: Payer: Self-pay | Admitting: Behavioral Health

## 2021-08-06 DIAGNOSIS — F900 Attention-deficit hyperactivity disorder, predominantly inattentive type: Secondary | ICD-10-CM

## 2021-08-06 NOTE — Telephone Encounter (Signed)
Filled 6/16 appt 9/15

## 2021-08-26 ENCOUNTER — Other Ambulatory Visit: Payer: Self-pay | Admitting: Behavioral Health

## 2021-08-26 DIAGNOSIS — F900 Attention-deficit hyperactivity disorder, predominantly inattentive type: Secondary | ICD-10-CM

## 2021-08-27 NOTE — Telephone Encounter (Signed)
Last filled 7/17, due 8/14  

## 2021-08-29 ENCOUNTER — Ambulatory Visit (HOSPITAL_COMMUNITY)
Admission: EM | Admit: 2021-08-29 | Discharge: 2021-08-29 | Disposition: A | Discharge status: Home / Self Care | Payer: BC Managed Care – PPO | Attending: Physician Assistant | Admitting: Physician Assistant

## 2021-08-29 ENCOUNTER — Encounter (HOSPITAL_COMMUNITY): Payer: Self-pay | Admitting: Emergency Medicine

## 2021-08-29 DIAGNOSIS — J069 Acute upper respiratory infection, unspecified: Secondary | ICD-10-CM | POA: Insufficient documentation

## 2021-08-29 DIAGNOSIS — Z20822 Contact with and (suspected) exposure to covid-19: Secondary | ICD-10-CM | POA: Insufficient documentation

## 2021-08-29 DIAGNOSIS — R0602 Shortness of breath: Secondary | ICD-10-CM | POA: Insufficient documentation

## 2021-08-29 DIAGNOSIS — R0982 Postnasal drip: Secondary | ICD-10-CM | POA: Diagnosis not present

## 2021-08-29 DIAGNOSIS — R051 Acute cough: Secondary | ICD-10-CM | POA: Insufficient documentation

## 2021-08-29 DIAGNOSIS — G9331 Postviral fatigue syndrome: Secondary | ICD-10-CM | POA: Diagnosis not present

## 2021-08-29 MED ORDER — DM-GUAIFENESIN ER 30-600 MG PO TB12: 1.0000 | ORAL_TABLET | Freq: Two times a day (BID) | ORAL | 0 refills | Status: DC

## 2021-08-29 MED ORDER — PREDNISONE 10 MG PO TABS
10.0000 mg | ORAL_TABLET | Freq: Three times a day (TID) | ORAL | 0 refills | Status: DC
Start: 2021-08-29 — End: 2022-02-27

## 2021-08-29 MED ORDER — FLUTICASONE PROPIONATE 50 MCG/ACT NA SUSP: 2.0000 | Freq: Every day | NASAL | 2 refills | Status: DC

## 2021-08-29 NOTE — Discharge Instructions (Addendum)
Advised to continue to drink fluids and eat on a regular basis. Advised to use Flonase nasal spray 2 sprays each nostril once a day in order to help decrease the sinus congestion and the postnasal drip. Advised to take Mucinex DM 1 every 12 hours to help control the cough and to loosen any chest congestion that may be present. Advised to take the prednisone 10 mg 1 3 times a day for 5 days only to help reduce the sinus and respiratory congestion. Nasal swab has been taken in order to run a COVID test, this takes 48 hours to complete, if our office does not call you within that time then consider the test negative.  You can go on MyChart to be able to see the results when opposed to 24 and 48 hours. Advised follow-up with PCP return to urgent care if symptoms fail to improve.

## 2021-08-29 NOTE — ED Provider Notes (Signed)
[facility]    CSN: [id number] Arrival date & time: 08/29/21  1059      History   Chief Complaint Chief Complaint  Patient presents with   Cough    Over two weeks of lingering cough. Some shortness of breath. Started with a fever. And sinus congestion that will not go away. - Entered by patient    HPI Luke Estrada is a 36 y.o. male.   36 year old male presents with cough and fatigue.  Indicates that 2 weeks ago he had mild upper respiratory symptoms and fever of 100 which lasted for 24 hours and resolved.  Patient indicates for the past 2 weeks he has had intermittent mild cough with very little production, and intermittent shortness of breath without wheezing.  Patient indicates he notices the shortness of breath more when he tries to workout, swimming laps.  He also indicates that he has had some upper respiratory symptoms that have been persistent with sinus congestion postnasal drip and irritated throat.  He indicates he has not had fever, chills, is tolerating fluids and eating well.  Patient relates that he has been fatigued over the past 2 weeks and just cannot get back to feeling normal.  Patient relates he did receive 2 doses of the COVID-vaccine, and he has not been around any friends or family that are sick but he does have 2 young children at home.   Cough Associated symptoms: shortness of breath     Past Medical History:  Diagnosis Date   ADHD (attention deficit hyperactivity disorder)    Glaucoma    Hypertension     Patient Active Problem List   Diagnosis Date Noted   Hypertension 03/17/2020   Glaucoma of both eyes 03/17/2020   Encounter for general adult medical examination with abnormal findings 03/16/2020   Elevated LFTs 03/16/2020   ADHD (attention deficit hyperactivity disorder) 10/12/2012    Past Surgical History:  Procedure Laterality Date   APPENDECTOMY  2000       Home Medications    Prior to Admission medications   Medication Sig  Start Date End Date Taking? Authorizing Provider  dextromethorphan-guaiFENesin (MUCINEX DM) 30-600 MG 12hr tablet Take 1 tablet by mouth 2 (two) times daily. 08/29/21  Yes Ellsworth Lennox, PA-C  fluticasone [facility]) 50 MCG/ACT nasal spray Place 2 sprays into both nostrils daily. 08/29/21  Yes Ellsworth Lennox, PA-C  predniSONE (DELTASONE) 10 MG tablet Take 1 tablet (10 mg total) by mouth 3 (three) times daily. 08/29/21  Yes Ellsworth Lennox, PA-C  lisdexamfetamine (VYVANSE) 60 MG capsule Take 1 capsule (60 mg total) by mouth every morning. 10/14/20 01/12/21  Joan Flores, NP  lisdexamfetamine (VYVANSE) 60 MG capsule Take 1 capsule (60 mg total) by mouth every morning. 01/10/21 02/09/21  Joan Flores, NP  VYVANSE 60 MG capsule Take 1 capsule (60 mg total) by mouth every morning. 08/06/21   Joan Flores, NP    Family History Family History  Problem Relation Age of Onset   Hypertension Mother    Hypercholesterolemia Mother    Hypertension Father    Celiac disease Brother     Social History Social History   Tobacco Use   Smoking status: Never   Smokeless tobacco: Never  Vaping Use   Vaping Use: Never used  Substance Use Topics   Alcohol use: Yes    Alcohol/week: 1.0 standard drink of alcohol    Types: 1 Cans of beer per week    Comment: under safe drinking  levels   Drug use: Never     Allergies   Patient has no known allergies.   Review of Systems Review of Systems  HENT:  Positive for postnasal drip and sinus pressure.   Respiratory:  Positive for cough and shortness of breath.      Physical Exam Triage Vital Signs ED Triage Vitals  Enc Vitals Group     BP 08/29/21 1156 (!) 152/109     Pulse Rate 08/29/21 1156 99     Resp 08/29/21 1156 16     Temp 08/29/21 1156 98.7 F (37.1 C)     Temp Source 08/29/21 1156 Oral     SpO2 08/29/21 1156 99 %     Weight --      Height --      Head Circumference --      Peak Flow --      Pain Score 08/29/21 1157 1     Pain Loc --      Pain  Edu? --      Excl. in GC? --    No data found.  Updated Vital Signs BP (!) 152/109 (BP Location: Left Arm)   Pulse 99   Temp 98.7 F (37.1 C) (Oral)   Resp 16   SpO2 99%   Visual Acuity Right Eye Distance:   Left Eye Distance:   Bilateral Distance:    Right Eye Near:   Left Eye Near:    Bilateral Near:     Physical Exam Constitutional:      Appearance: Normal appearance.  HENT:     Right Ear: Ear canal normal. Tympanic membrane is injected.     Left Ear: Ear canal normal. Tympanic membrane is injected.     Mouth/Throat:     Mouth: Mucous membranes are moist.     Pharynx: Oropharynx is clear. No pharyngeal swelling or posterior oropharyngeal erythema.  Cardiovascular:     Rate and Rhythm: Normal rate and regular rhythm.     Heart sounds: Normal heart sounds.  Pulmonary:     Effort: Pulmonary effort is normal.     Breath sounds: Normal breath sounds and air entry. No wheezing, rhonchi or rales.  Lymphadenopathy:     Cervical: No cervical adenopathy.  Neurological:     Mental Status: He is alert.      UC Treatments / Results  Labs (all labs ordered are listed, but only abnormal results are displayed) Labs Reviewed  SARS CORONAVIRUS 2 (TAT 6-24 HRS)    EKG   Radiology No results found.  Procedures Procedures (including critical care time)  Medications Ordered in UC Medications - No data to display  Initial Impression / Assessment and Plan / UC Course  I have reviewed the triage vital signs and the nursing notes.  Pertinent labs & imaging results that were available during my care of the patient were reviewed by me and considered in my medical decision making (see chart for details).    Plan: 1.  COVID test is pending 2.  Advised to use the Flonase nasal spray 2 sprays each nostril once a day to help decrease the postnasal drip and sinus congestion. 3.  Advised to take Mucinex DM 1 every 12 hours to help control the cough and loosen any chest  congestion. 4.  Advised take prednisone 10 mg 1 3 times a day for 5 days only to help decrease the acute sinus inflammation and the bronchial inflammation. 5.  Advised to follow-up PCP return to urgent  care if symptoms fail to improve over the next week.  (Consider CBC and chest x-ray if symptoms do not improve) Final Clinical Impressions(s) / UC Diagnoses   Final diagnoses:  Acute cough  Acute upper respiratory infection  Postviral fatigue syndrome     Discharge Instructions      Advised to continue to drink fluids and eat on a regular basis. Advised to use Flonase nasal spray 2 sprays each nostril once a day in order to help decrease the sinus congestion and the postnasal drip. Advised to take Mucinex DM 1 every 12 hours to help control the cough and to loosen any chest congestion that may be present. Advised to take the prednisone 10 mg 1 3 times a day for 5 days only to help reduce the sinus and respiratory congestion. Nasal swab has been taken in order to run a COVID test, this takes 48 hours to complete, if our office does not call you within that time then consider the test negative.  You can go on MyChart to be able to see the results when opposed to 24 and 48 hours. Advised follow-up with PCP return to urgent care if symptoms fail to improve.    ED Prescriptions     Medication Sig Dispense Auth. Provider   dextromethorphan-guaiFENesin (MUCINEX DM) 30-600 MG 12hr tablet Take 1 tablet by mouth 2 (two) times daily. 20 tablet Ellsworth Lennox, PA-C   fluticasone [facility]) 50 MCG/ACT nasal spray Place 2 sprays into both nostrils daily. 11.1 mL Ellsworth Lennox, PA-C   predniSONE (DELTASONE) 10 MG tablet Take 1 tablet (10 mg total) by mouth 3 (three) times daily. 15 tablet Ellsworth Lennox, PA-C      PDMP not reviewed this encounter.   Ellsworth Lennox, PA-C 08/29/21 1242

## 2021-08-29 NOTE — ED Triage Notes (Addendum)
2 weeks ago began having a cough, sore throat, nasal congestion, body aches, fever. Symptoms were worse at night. Most symptoms have improved at this point. C/o lingering cough, and something that feels odd in the back of his throat. Was trying to exercise yesterday and felt SOB, which prompted him to come be seen.

## 2021-08-30 LAB — SARS CORONAVIRUS 2 (TAT 6-24 HRS): SARS Coronavirus 2: NEGATIVE

## 2021-10-03 ENCOUNTER — Other Ambulatory Visit: Payer: Self-pay

## 2021-10-03 ENCOUNTER — Telehealth: Payer: Self-pay | Admitting: Behavioral Health

## 2021-10-03 DIAGNOSIS — F900 Attention-deficit hyperactivity disorder, predominantly inattentive type: Secondary | ICD-10-CM

## 2021-10-03 MED ORDER — LISDEXAMFETAMINE DIMESYLATE 50 MG PO CAPS: 50.0000 mg | ORAL_CAPSULE | Freq: Every morning | ORAL | 0 refills | Status: DC

## 2021-10-03 NOTE — Telephone Encounter (Signed)
Pended.

## 2021-10-03 NOTE — Telephone Encounter (Signed)
Luke Estrada called today at 9:25 to request refill of his Vyvanse.  Appt 9/15.  Send to [facility].  But they only have 50mg  available.

## 2021-10-05 ENCOUNTER — Encounter: Payer: Self-pay | Admitting: Behavioral Health

## 2021-10-05 ENCOUNTER — Ambulatory Visit (INDEPENDENT_AMBULATORY_CARE_PROVIDER_SITE_OTHER): Payer: BC Managed Care – PPO | Admitting: Behavioral Health

## 2021-10-05 DIAGNOSIS — F900 Attention-deficit hyperactivity disorder, predominantly inattentive type: Secondary | ICD-10-CM

## 2021-10-05 NOTE — Progress Notes (Signed)
[facility]  Patient ID: Luke Estrada,  MRN: [id number]  PCP: Etta Grandchild, MD  Date of Evaluation: 10/05/2021 Time spent:30 minutes  Chief Complaint:  Chief Complaint   Follow-up; Medication Refill; ADHD; Patient Education     HISTORY/CURRENT STATUS: HPI  36 year old male presents to this office for follow up and medication refill. No changes this visit. He says that Vyvanse continues to work well for his attention and focus problems with ADHD.  He says that his family life is going well. He just got promoted on his job. No medication changes are indicated at this time. Anxiety 1/10 and depression is 0/10. Sleeping 7-8 hours per night. No mania, or psychosis. No SI/HI.    Past Psychiatric Medication Failures: Adderall Ritalin      Individual Medical History/ Review of Systems: Changes? :No   Allergies: Patient has no known allergies.  Current Medications:  Current Outpatient Medications:    dextromethorphan-guaiFENesin (MUCINEX DM) 30-600 MG 12hr tablet, Take 1 tablet by mouth 2 (two) times daily., Disp: 20 tablet, Rfl: 0   fluticasone (FLONASE) 50 MCG/ACT nasal spray, Place 2 sprays into both nostrils daily., Disp: 11.1 mL, Rfl: 2   lisdexamfetamine (VYVANSE) 50 MG capsule, Take 1 capsule (50 mg total) by mouth every morning., Disp: 30 capsule, Rfl: 0   lisdexamfetamine (VYVANSE) 60 MG capsule, Take 1 capsule (60 mg total) by mouth every morning., Disp: 30 capsule, Rfl: 0   predniSONE (DELTASONE) 10 MG tablet, Take 1 tablet (10 mg total) by mouth 3 (three) times daily., Disp: 15 tablet, Rfl: 0   VYVANSE 60 MG capsule, Take 1 capsule (60 mg total) by mouth every morning., Disp: 30 capsule, Rfl: 0 Medication Side Effects: none  Family Medical/ Social History: Changes? No  MENTAL HEALTH EXAM:  There were no vitals taken for this visit.There is no height or weight on file to calculate BMI.  General Appearance: Casual and Neat  Eye Contact:  Good  Speech:   Clear and Coherent  Volume:  Normal  Mood:  NA  Affect:  Appropriate  Thought Process:  Coherent  Orientation:  Full (Time, Place, and Person)  Thought Content: Logical   Suicidal Thoughts:  No  Homicidal Thoughts:  No  Memory:  WNL  Judgement:  Good  Insight:  Good  Psychomotor Activity:  Normal  Concentration:  Concentration: Good  Recall:  Good  Fund of Knowledge: Good  Language: Good  Assets:  Desire for Improvement  ADL's:  Intact  Cognition: WNL  Prognosis:  Good    DIAGNOSES:    ICD-10-CM   1. Attention deficit hyperactivity disorder (ADHD), predominantly inattentive type  F90.0       Receiving Psychotherapy: No    RECOMMENDATIONS:   Greater than 50% of  30 min face to face time with patient was spent on counseling and coordination of care. We reviewed his medications. No changes this visit. Needing refill. Not concerned in misuse of medication. He understood and did not want to make any other changes. We discussed concerns over his continued elevated BP this visit.  Recommended he follow up with his PCP promptly.   Will continue with Vyvanse 60 mg daily Will report worsening condition prompty To follow up with PCP for Cholesterol and heart health regularly Will monitor BP and HR on regular basis Will follow up in 3 months to reassess  Discussed potential benefits, risks, and side effects of stimulants with patient to include increased heart rate, palpitations, insomnia,  increased anxiety, increased irritability, or decreased appetite.  Instructed patient to contact office if experiencing any significant tolerability issues.  Reviewed PDMP     Joan Flores, NP

## 2021-10-29 ENCOUNTER — Other Ambulatory Visit: Payer: Self-pay | Admitting: Behavioral Health

## 2021-10-29 DIAGNOSIS — F900 Attention-deficit hyperactivity disorder, predominantly inattentive type: Secondary | ICD-10-CM

## 2021-11-26 ENCOUNTER — Other Ambulatory Visit: Payer: Self-pay | Admitting: Behavioral Health

## 2021-11-26 DIAGNOSIS — F900 Attention-deficit hyperactivity disorder, predominantly inattentive type: Secondary | ICD-10-CM

## 2021-11-27 ENCOUNTER — Telehealth: Payer: Self-pay | Admitting: Behavioral Health

## 2021-11-27 NOTE — Telephone Encounter (Signed)
Pended.

## 2021-11-27 NOTE — Telephone Encounter (Signed)
Next visit is 04/05/22. Requesting refill on Vyvanse 60 mg called to:  [facility], [place]   Phone: [phone]  Fax: [phone]    It is in stock per patient.

## 2022-02-11 ENCOUNTER — Other Ambulatory Visit: Payer: Self-pay | Admitting: Behavioral Health

## 2022-02-11 ENCOUNTER — Telehealth: Payer: Self-pay | Admitting: Behavioral Health

## 2022-02-11 DIAGNOSIS — F900 Attention-deficit hyperactivity disorder, predominantly inattentive type: Secondary | ICD-10-CM

## 2022-02-11 NOTE — Telephone Encounter (Signed)
Pt called in to let us know he has new insurance and will need a PA for Vyvanse. The Vyvanse RX is at [facility].  New Insur-[employer] Open Access   ID # [id number] is the new plan ID.

## 2022-02-12 NOTE — Telephone Encounter (Signed)
[facility] and they said can't be filled until 1/30 and can't tell if it will need a PA until they run it.

## 2022-02-12 NOTE — Telephone Encounter (Signed)
Tried submitted a PA with [facility], who referred to [place] for pharmacy. Vyvanse 60 mg per cover my meds does not need a prior authorization. Contacted [employer] directly at [phone], will discuss with agent for clarification. Medication may be high with a deductible and being the first of the year.    After speaking with rep from [facility] a prior authorization is not required for Vyvanse 60 mg brand or generic.

## 2022-02-12 NOTE — Telephone Encounter (Signed)
Patient notified of PA issue and that pharmacy said not due until 1/30.

## 2022-02-27 ENCOUNTER — Ambulatory Visit: Payer: Managed Care, Other (non HMO) | Admitting: Internal Medicine

## 2022-02-27 ENCOUNTER — Encounter: Payer: Self-pay | Admitting: Internal Medicine

## 2022-02-27 VITALS — BP 148/96 | HR 99 | Temp 98.3°F | Resp 16 | Ht 71.0 in | Wt 172.0 lb

## 2022-02-27 DIAGNOSIS — Z0001 Encounter for general adult medical examination with abnormal findings: Secondary | ICD-10-CM

## 2022-02-27 DIAGNOSIS — I10 Essential (primary) hypertension: Secondary | ICD-10-CM

## 2022-02-27 DIAGNOSIS — Z23 Encounter for immunization: Secondary | ICD-10-CM | POA: Diagnosis not present

## 2022-02-27 LAB — CBC WITH DIFFERENTIAL/PLATELET
Basophils Absolute: 0 10*3/uL (ref 0.0–0.1)
Basophils Relative: 0.2 % (ref 0.0–3.0)
Eosinophils Absolute: 0.1 10*3/uL (ref 0.0–0.7)
Eosinophils Relative: 0.6 % (ref 0.0–5.0)
HCT: 41.9 % (ref 39.0–52.0)
Hemoglobin: 14.6 g/dL (ref 13.0–17.0)
Lymphocytes Relative: 19.7 % (ref 12.0–46.0)
Lymphs Abs: 1.9 10*3/uL (ref 0.7–4.0)
MCHC: 34.8 g/dL (ref 30.0–36.0)
MCV: 93.5 fl (ref 78.0–100.0)
Monocytes Absolute: 0.8 10*3/uL (ref 0.1–1.0)
Monocytes Relative: 8.3 % (ref 3.0–12.0)
Neutro Abs: 6.9 10*3/uL (ref 1.4–7.7)
Neutrophils Relative %: 71.2 % (ref 43.0–77.0)
Platelets: 317 10*3/uL (ref 150.0–400.0)
RBC: 4.48 Mil/uL (ref 4.22–5.81)
RDW: 12.8 % (ref 11.5–15.5)
WBC: 9.7 10*3/uL (ref 4.0–10.5)

## 2022-02-27 LAB — BASIC METABOLIC PANEL
BUN: 15 mg/dL (ref 6–23)
CO2: 28 mEq/L (ref 19–32)
Calcium: 9.4 mg/dL (ref 8.4–10.5)
Chloride: 101 mEq/L (ref 96–112)
Creatinine, Ser: 0.91 mg/dL (ref 0.40–1.50)
GFR: 106.52 mL/min (ref >60.00)
Glucose, Bld: 92 mg/dL (ref 70–99)
Potassium: 4 mEq/L (ref 3.5–5.1)
Sodium: 140 mEq/L (ref 135–145)

## 2022-02-27 LAB — URINALYSIS, ROUTINE W REFLEX MICROSCOPIC
Bilirubin Urine: NEGATIVE
Hgb urine dipstick: NEGATIVE
Ketones, ur: NEGATIVE
Leukocytes,Ua: NEGATIVE
Nitrite: NEGATIVE
RBC / HPF: NONE SEEN (ref 0–?)
Specific Gravity, Urine: 1.01 (ref 1.000–1.030)
Total Protein, Urine: NEGATIVE
Urine Glucose: NEGATIVE
Urobilinogen, UA: 0.2 (ref 0.0–1.0)
WBC, UA: NONE SEEN (ref 0–?)
pH: 7 (ref 5.0–8.0)

## 2022-02-27 LAB — HEPATIC FUNCTION PANEL
ALT: 17 U/L (ref 0–53)
AST: 23 U/L (ref 0–37)
Albumin: 5 g/dL (ref 3.5–5.2)
Alkaline Phosphatase: 77 U/L (ref 39–117)
Bilirubin, Direct: 0.2 mg/dL (ref 0.0–0.3)
Total Bilirubin: 0.9 mg/dL (ref 0.2–1.2)
Total Protein: 8 g/dL (ref 6.0–8.3)

## 2022-02-27 LAB — TSH: TSH: 1.79 u[IU]/mL (ref 0.35–5.50)

## 2022-02-27 NOTE — Progress Notes (Signed)
Subjective:  Patient ID: Luke Estrada, male    DOB: 1985/06/11  Age: 37 y.o. MRN: [id number]  CC: Annual Exam and Hypertension   HPI AQIB STOCK presents for a CPX and f/up -   He runs about 9 miles a day.  His endurance is good.  He denies chest pain, shortness of breath, diaphoresis, edema.  Over the last 6 weeks he has noted variations in his blood pressure.  He has had a systolic blood pressure as high as 157 and as low as 123456 and a diastolic as high as [id number] and as low as 75.  Mitigating factors are the occasional binge on alcohol and the use of Vyvanse.  He has abstained from alcohol for several weeks now.  He denies dizzy spells, lightheadedness, near-syncope, or palpitations.  Outpatient Medications Prior to Visit  Medication Sig Dispense Refill   dorzolamide-timolol (COSOPT) 2-0.5 % ophthalmic solution Place 1 drop into both eyes.     VYVANSE 60 MG capsule Take 1 capsule (60 mg total) by mouth every morning. 30 capsule 0   fluticasone (FLONASE) 50 MCG/ACT nasal spray Place 2 sprays into both nostrils daily. 11.1 mL 2   predniSONE (DELTASONE) 10 MG tablet Take 1 tablet (10 mg total) by mouth 3 (three) times daily. 15 tablet 0   lisdexamfetamine (VYVANSE) 60 MG capsule Take 1 capsule (60 mg total) by mouth every morning. 30 capsule 0   lisdexamfetamine (VYVANSE) 60 MG capsule Take 1 capsule (60 mg total) by mouth every morning. Take 1 capsule (60 mg total) by mouth every morning. (Patient not taking: Reported on 02/27/2022) 30 capsule 0   VYVANSE 60 MG capsule Take 1 capsule (60 mg total) by mouth every morning. 30 capsule 0   VYVANSE 60 MG capsule Take 1 capsule (60 mg total) by mouth every morning. Take 1 capsule (60 mg total) by mouth every morning. 30 capsule 0   dextromethorphan-guaiFENesin (MUCINEX DM) 30-600 MG 12hr tablet Take 1 tablet by mouth 2 (two) times daily. (Patient not taking: Reported on 02/27/2022) 20 tablet 0   No facility-administered medications prior to visit.     ROS Review of Systems  Constitutional:  Negative for chills, diaphoresis, fatigue and fever.  HENT: Negative.    Eyes: Negative.   Respiratory:  Negative for cough, chest tightness, shortness of breath and wheezing.   Cardiovascular:  Negative for chest pain, palpitations and leg swelling.  Gastrointestinal:  Negative for abdominal pain, diarrhea, nausea and vomiting.  Endocrine: Negative.   Genitourinary: Negative.  Negative for decreased urine volume, difficulty urinating, hematuria, penile swelling, scrotal swelling and testicular pain.  Musculoskeletal: Negative.   Skin: Negative.   Neurological:  Negative for dizziness, weakness and light-headedness.  Hematological:  Negative for adenopathy. Does not bruise/bleed easily.  Psychiatric/Behavioral: Negative.      Objective:  BP (!) 148/96 (BP Location: Left Arm, Patient Position: Sitting, Cuff Size: Normal)   Pulse 99   Temp 98.3 F (36.8 C) (Oral)   Resp 16   Ht 5' 11"$  (1.803 m)   Wt 172 lb (78 kg)   SpO2 100%   BMI 23.99 kg/m   BP Readings from Last 3 Encounters:  02/27/22 (!) 148/96  08/29/21 (!) 152/109  03/16/20 (!) 144/94    Wt Readings from Last 3 Encounters:  02/27/22 172 lb (78 kg)  03/16/20 172 lb (78 kg)  01/10/17 168 lb (76.2 kg)    Physical Exam Vitals reviewed.  HENT:     Nose:  Nose normal.     Mouth/Throat:     Mouth: Mucous membranes are moist.  Eyes:     General: No scleral icterus.    Conjunctiva/sclera: Conjunctivae normal.  Cardiovascular:     Rate and Rhythm: Normal rate and regular rhythm.     Heart sounds: No murmur heard.    Comments: EKG- NSR, 80 bpm No LVH Normal EKG Pulmonary:     Effort: Pulmonary effort is normal.     Breath sounds: No stridor. No wheezing, rhonchi or rales.  Abdominal:     General: Abdomen is flat. Bowel sounds are normal. There is no distension or abdominal bruit.     Palpations: There is no hepatomegaly, splenomegaly or mass.     Tenderness: There  is no abdominal tenderness. There is no guarding.     Hernia: No hernia is present.  Musculoskeletal:        General: Normal range of motion.     Cervical back: Neck supple.     Right lower leg: No edema.     Left lower leg: No edema.  Lymphadenopathy:     Cervical: No cervical adenopathy.  Skin:    General: Skin is warm and dry.  Neurological:     General: No focal deficit present.     Mental Status: He is alert. Mental status is at baseline.  Psychiatric:        Mood and Affect: Mood normal.        Behavior: Behavior normal.     Lab Results  Component Value Date   WBC 9.7 02/27/2022   HGB 14.6 02/27/2022   HCT 41.9 02/27/2022   PLT 317.0 02/27/2022   GLUCOSE 92 02/27/2022   CHOL 159 03/16/2020   TRIG 173.0 (H) 03/16/2020   HDL 30.70 (L) 03/16/2020   LDLCALC 94 03/16/2020   ALT 17 02/27/2022   AST 23 02/27/2022   NA 140 02/27/2022   K 4.0 02/27/2022   CL 101 02/27/2022   CREATININE 0.91 02/27/2022   BUN 15 02/27/2022   CO2 28 02/27/2022   TSH 1.79 02/27/2022   INR 1.1 (H) 03/16/2020   HGBA1C 5.3 10/12/2012    No results found.  Assessment & Plan:   Chanoch was seen today for annual exam and hypertension.  Diagnoses and all orders for this visit:  Primary hypertension- His blood pressure is not well-controlled.  He prefers not to take an antihypertensive at this time.  I have encouraged him to discontinue the use of amphetamines.  Will check labs to screen for secondary causes and endorgan damage.  His EKG is negative for LVH. -     Basic metabolic panel; Future -     Aldosterone + renin activity w/ ratio; Future -     CBC with Differential/Platelet; Future -     TSH; Future -     Urinalysis, Routine w reflex microscopic; Future -     Hepatic function panel; Future -     EKG 12-Lead -     Hepatic function panel -     Urinalysis, Routine w reflex microscopic -     TSH -     CBC with Differential/Platelet -     Aldosterone + renin activity w/ ratio -      Basic metabolic panel  Encounter for general adult medical examination with abnormal findings- Exam completed, labs reviewed, vaccines reviewed and updated, no cancer screenings indicated, patient education was given.  Need for vaccination -  Flu Vaccine QUAD 8moIM (Fluarix, Fluzone & Alfiuria Quad PF)   I have discontinued LLurena JoinerD. Kollman's dextromethorphan-guaiFENesin, fluticasone, and predniSONE. I am also having him maintain his lisdexamfetamine, Vyvanse, Vyvanse, lisdexamfetamine, Vyvanse, and dorzolamide-timolol.  No orders of the defined types were placed in this encounter.    Follow-up: Return in about 3 months (around 05/28/2022).  TScarlette Calico MD

## 2022-02-27 NOTE — Patient Instructions (Signed)
Hypertension, Adult High blood pressure (hypertension) is when the force of blood pumping through the arteries is too strong. The arteries are the blood vessels that carry blood from the heart throughout the body. Hypertension forces the heart to work harder to pump blood and may cause arteries to become narrow or stiff. Untreated or uncontrolled hypertension can lead to a heart attack, heart failure, a stroke, kidney disease, and other problems. A blood pressure reading consists of a higher number over a lower number. Ideally, your blood pressure should be below 120/80. The first ("top") number is called the systolic pressure. It is a measure of the pressure in your arteries as your heart beats. The second ("bottom") number is called the diastolic pressure. It is a measure of the pressure in your arteries as the heart relaxes. What are the causes? The exact cause of this condition is not known. There are some conditions that result in high blood pressure. What increases the risk? Certain factors may make you more likely to develop high blood pressure. Some of these risk factors are under your control, including: Smoking. Not getting enough exercise or physical activity. Being overweight. Having too much fat, sugar, calories, or salt (sodium) in your diet. Drinking too much alcohol. Other risk factors include: Having a personal history of heart disease, diabetes, high cholesterol, or kidney disease. Stress. Having a family history of high blood pressure and high cholesterol. Having obstructive sleep apnea. Age. The risk increases with age. What are the signs or symptoms? High blood pressure may not cause symptoms. Very high blood pressure (hypertensive crisis) may cause: Headache. Fast or irregular heartbeats (palpitations). Shortness of breath. Nosebleed. Nausea and vomiting. Vision changes. Severe chest pain, dizziness, and seizures. How is this diagnosed? This condition is diagnosed by  measuring your blood pressure while you are seated, with your arm resting on a flat surface, your legs uncrossed, and your feet flat on the floor. The cuff of the blood pressure monitor will be placed directly against the skin of your upper arm at the level of your heart. Blood pressure should be measured at least twice using the same arm. Certain conditions can cause a difference in blood pressure between your right and left arms. If you have a high blood pressure reading during one visit or you have normal blood pressure with other risk factors, you may be asked to: Return on a different day to have your blood pressure checked again. Monitor your blood pressure at home for 1 week or longer. If you are diagnosed with hypertension, you may have other blood or imaging tests to help your health care provider understand your overall risk for other conditions. How is this treated? This condition is treated by making healthy lifestyle changes, such as eating healthy foods, exercising more, and reducing your alcohol intake. You may be referred for counseling on a healthy diet and physical activity. Your health care provider may prescribe medicine if lifestyle changes are not enough to get your blood pressure under control and if: Your systolic blood pressure is above 130. Your diastolic blood pressure is above 80. Your personal target blood pressure may vary depending on your medical conditions, your age, and other factors. Follow these instructions at home: Eating and drinking  Eat a diet that is high in fiber and potassium, and low in sodium, added sugar, and fat. An example of this eating plan is called the DASH diet. DASH stands for Dietary Approaches to Stop Hypertension. To eat this way: Eat   plenty of fresh fruits and vegetables. Try to fill one half of your plate at each meal with fruits and vegetables. Eat whole grains, such as whole-wheat pasta, brown rice, or whole-grain bread. Fill about one  fourth of your plate with whole grains. Eat or drink low-fat dairy products, such as skim milk or low-fat yogurt. Avoid fatty cuts of meat, processed or cured meats, and poultry with skin. Fill about one fourth of your plate with lean proteins, such as fish, chicken without skin, beans, eggs, or tofu. Avoid pre-made and processed foods. These tend to be higher in sodium, added sugar, and fat. Reduce your daily sodium intake. Many people with hypertension should eat less than 1,500 mg of sodium a day. Do not drink alcohol if: Your health care provider tells you not to drink. You are pregnant, may be pregnant, or are planning to become pregnant. If you drink alcohol: Limit how much you have to: 0-1 drink a day for women. 0-2 drinks a day for men. Know how much alcohol is in your drink. In the [place] one drink equals one 12 oz bottle of beer (355 mL), one 5 oz glass of wine (148 mL), or one 1 oz glass of hard liquor (44 mL). Lifestyle  Work with your health care provider to maintain a healthy body weight or to lose weight. Ask what an ideal weight is for you. Get at least 30 minutes of exercise that causes your heart to beat faster (aerobic exercise) most days of the week. Activities may include walking, swimming, or biking. Include exercise to strengthen your muscles (resistance exercise), such as Pilates or lifting weights, as part of your weekly exercise routine. Try to do these types of exercises for 30 minutes at least 3 days a week. Do not use any products that contain nicotine or tobacco. These products include cigarettes, chewing tobacco, and vaping devices, such as e-cigarettes. If you need help quitting, ask your health care provider. Monitor your blood pressure at home as told by your health care provider. Keep all follow-up visits. This is important. Medicines Take over-the-counter and prescription medicines only as told by your health care provider. Follow directions carefully. Blood  pressure medicines must be taken as prescribed. Do not skip doses of blood pressure medicine. Doing this puts you at risk for problems and can make the medicine less effective. Ask your health care provider about side effects or reactions to medicines that you should watch for. Contact a health care provider if you: Think you are having a reaction to a medicine you are taking. Have headaches that keep coming back (recurring). Feel dizzy. Have swelling in your ankles. Have trouble with your vision. Get help right away if you: Develop a severe headache or confusion. Have unusual weakness or numbness. Feel faint. Have severe pain in your chest or abdomen. Vomit repeatedly. Have trouble breathing. These symptoms may be an emergency. Get help right away. Call 911. Do not wait to see if the symptoms will go away. Do not drive yourself to the hospital. Summary Hypertension is when the force of blood pumping through your arteries is too strong. If this condition is not controlled, it may put you at risk for serious complications. Your personal target blood pressure may vary depending on your medical conditions, your age, and other factors. For most people, a normal blood pressure is less than 120/80. Hypertension is treated with lifestyle changes, medicines, or a combination of both. Lifestyle changes include losing weight, eating a healthy,   low-sodium diet, exercising more, and limiting alcohol. This information is not intended to replace advice given to you by your health care provider. Make sure you discuss any questions you have with your health care provider. Document Revised: 11/14/2020 Document Reviewed: 11/14/2020 Elsevier Patient Education  2023 Elsevier Inc.  

## 2022-03-06 LAB — ALDOSTERONE + RENIN ACTIVITY W/ RATIO
ALDO / PRA Ratio: 6 Ratio (ref 0.9–28.9)
Aldosterone: 5 ng/dL
Renin Activity: 0.83 ng/mL/h (ref 0.25–5.82)

## 2022-03-18 ENCOUNTER — Other Ambulatory Visit: Payer: Self-pay | Admitting: Behavioral Health

## 2022-03-18 DIAGNOSIS — F900 Attention-deficit hyperactivity disorder, predominantly inattentive type: Secondary | ICD-10-CM

## 2022-04-05 ENCOUNTER — Encounter: Payer: Self-pay | Admitting: Behavioral Health

## 2022-04-05 ENCOUNTER — Ambulatory Visit (INDEPENDENT_AMBULATORY_CARE_PROVIDER_SITE_OTHER): Payer: Managed Care, Other (non HMO) | Admitting: Behavioral Health

## 2022-04-05 DIAGNOSIS — F900 Attention-deficit hyperactivity disorder, predominantly inattentive type: Secondary | ICD-10-CM | POA: Diagnosis not present

## 2022-04-05 DIAGNOSIS — F4322 Adjustment disorder with anxiety: Secondary | ICD-10-CM | POA: Diagnosis not present

## 2022-04-05 MED ORDER — LISDEXAMFETAMINE DIMESYLATE 40 MG PO CAPS: 40.0000 mg | ORAL_CAPSULE | ORAL | 0 refills | Status: DC

## 2022-04-05 NOTE — Progress Notes (Signed)
[facility]  Patient ID: Luke Estrada,  MRN: [id number]  PCP: Janith Lima, MD  Date of Evaluation: 04/05/2022 Time spent:30 minutes  Chief Complaint:  Chief Complaint   ADHD; Follow-up; Medication Refill; Patient Education; Medication Problem     HISTORY/CURRENT STATUS: HPI  37 year old male presents to this office for follow up and medication refill. He is very concerned about elevated BP related to his Vyvanse. He is wanting to wean off and look for alternatives. He is dieting and working out.   Says that his family life is going well. Follows up with PCP regularly. Request reduction or change of medication. Anxiety 1/10 and depression is 0/10. Sleeping 7-8 hours per night. No mania, or psychosis. No SI/HI.    Past Psychiatric Medication Failures: Adderall Ritalin   Individual Medical History/ Review of Systems: Changes? :No   Allergies: Patient has no known allergies.  Current Medications:  Current Outpatient Medications:    [START ON 04/17/2022] lisdexamfetamine (VYVANSE) 40 MG capsule, Take 1 capsule (40 mg total) by mouth every morning., Disp: 30 capsule, Rfl: 0   dorzolamide-timolol (COSOPT) 2-0.5 % ophthalmic solution, Place 1 drop into both eyes., Disp: , Rfl:    VYVANSE 60 MG capsule, TAKE ONE CAPSULE BY MOUTH IN THE MORNING, Disp: 30 capsule, Rfl: 0 Medication Side Effects: none  Family Medical/ Social History: Changes? No  MENTAL HEALTH EXAM:  There were no vitals taken for this visit.There is no height or weight on file to calculate BMI.  General Appearance: Casual, Neat, and Well Groomed  Eye Contact:  Good  Speech:  Clear and Coherent  Volume:  Normal  Mood:  NA  Affect:  Appropriate  Thought Process:  Coherent  Orientation:  Full (Time, Place, and Person)  Thought Content: Logical   Suicidal Thoughts:  No  Homicidal Thoughts:  No  Memory:  WNL  Judgement:  Good  Insight:  Good  Psychomotor Activity:  Normal  Concentration:   Concentration: Good  Recall:  Good  Fund of Knowledge: Good  Language: Good  Assets:  Desire for Improvement  ADL's:  Intact  Cognition: WNL  Prognosis:  Good    DIAGNOSES:    ICD-10-CM   1. Attention deficit hyperactivity disorder (ADHD), predominantly inattentive type  F90.0 lisdexamfetamine (VYVANSE) 40 MG capsule    2. Adjustment disorder with anxiety  F43.22       Receiving Psychotherapy: No    RECOMMENDATIONS:   Greater than 50% of  30 min face to face time with patient was spent on counseling and coordination of care. We reviewed his medications. We discussed concerns over his continued elevated BP this visit.  He has made diet and exercise changes. BP is still abnormal and he is wanting to wean off his Vyvanse and try a non stimulant or medication with lower risk for elevated BP. We discussed medication options and well as supplements.     To reduce  Vyvanse to 40 mg daily Will report worsening condition prompty To follow up with PCP for Cholesterol and heart health regularly Will monitor BP and HR on regular basis Will follow up in 3 months to reassess  Discussed potential benefits, risks, and side effects of stimulants with patient to include increased heart rate, palpitations, insomnia, increased anxiety, increased irritability, or decreased appetite.  Instructed patient to contact office if experiencing any significant tolerability issues.  Reviewed PDMP        Elwanda Brooklyn, NP

## 2022-04-11 ENCOUNTER — Telehealth: Payer: Self-pay | Admitting: Behavioral Health

## 2022-04-11 NOTE — Telephone Encounter (Signed)
[facility] called at 3:46 to request approval for early refill of Luke Estrada's vyvanse.  He has presented his prescription with fill date of 04/17/22 but he says [place] is going out [place] and needs to take the medication with him. They will need approval or new script allowing the early refill.  Please call at [phone]

## 2022-04-12 NOTE — Telephone Encounter (Signed)
Authorized early RF.

## 2022-05-08 ENCOUNTER — Telehealth: Payer: Self-pay | Admitting: Behavioral Health

## 2022-05-08 ENCOUNTER — Other Ambulatory Visit: Payer: Self-pay | Admitting: Behavioral Health

## 2022-05-08 DIAGNOSIS — F900 Attention-deficit hyperactivity disorder, predominantly inattentive type: Secondary | ICD-10-CM

## 2022-05-08 NOTE — Telephone Encounter (Signed)
Due 4/19

## 2022-05-08 NOTE — Telephone Encounter (Signed)
Pt called to request that we send in a RF on Vyvanse  to [facility] [place] Next app 6/24

## 2022-05-10 ENCOUNTER — Other Ambulatory Visit: Payer: Self-pay

## 2022-05-10 DIAGNOSIS — F900 Attention-deficit hyperactivity disorder, predominantly inattentive type: Secondary | ICD-10-CM

## 2022-05-10 MED ORDER — LISDEXAMFETAMINE DIMESYLATE 40 MG PO CAPS: 40.0000 mg | ORAL_CAPSULE | ORAL | 0 refills | Status: DC

## 2022-05-10 MED ORDER — LISDEXAMFETAMINE DIMESYLATE 40 MG PO CAPS
40.0000 mg | ORAL_CAPSULE | ORAL | 0 refills | Status: DC
Start: 2022-05-10 — End: 2022-09-25

## 2022-05-10 NOTE — Telephone Encounter (Signed)
Pended.

## 2022-06-07 ENCOUNTER — Other Ambulatory Visit: Payer: Self-pay | Admitting: Behavioral Health

## 2022-06-07 ENCOUNTER — Telehealth: Payer: Self-pay | Admitting: Behavioral Health

## 2022-06-07 DIAGNOSIS — F900 Attention-deficit hyperactivity disorder, predominantly inattentive type: Secondary | ICD-10-CM

## 2022-06-07 MED ORDER — LISDEXAMFETAMINE DIMESYLATE 30 MG PO CAPS
30.0000 mg | ORAL_CAPSULE | Freq: Every day | ORAL | 0 refills | Status: DC
Start: 2022-06-07 — End: 2022-07-05

## 2022-06-07 NOTE — Telephone Encounter (Signed)
Pt lvm about sending in a new script of vyvanse. He is lowering his dose and needs a 30 mg. Pharmacy is [facility]

## 2022-06-26 ENCOUNTER — Ambulatory Visit (INDEPENDENT_AMBULATORY_CARE_PROVIDER_SITE_OTHER): Payer: 59 | Admitting: Behavioral Health

## 2022-06-26 ENCOUNTER — Encounter: Payer: Self-pay | Admitting: Behavioral Health

## 2022-06-26 DIAGNOSIS — F900 Attention-deficit hyperactivity disorder, predominantly inattentive type: Secondary | ICD-10-CM | POA: Diagnosis not present

## 2022-06-26 NOTE — Progress Notes (Signed)
[facility]  Patient ID: Luke Estrada,  MRN: [id number]  PCP: Etta Grandchild, MD  Date of Evaluation: 06/26/2022 Time spent:30 minutes  Chief Complaint:  Chief Complaint   ADHD; Follow-up; Patient Education; Medication Refill     HISTORY/CURRENT STATUS: HPI  37 year old male presents to this office for follow up and medication refill. He is very concerned about elevated BP related to his Vyvanse. He is wanting to wean off and look for alternatives. He is dieting and working out.   Says that his family life is going well. Follows up with PCP regularly. Request reduction or change of medication. Anxiety 1/10 and depression is 0/10. Sleeping 7-8 hours per night. No mania, or psychosis. No SI/HI.    Past Psychiatric Medication Failures: Adderall Ritalin    Individual Medical History/ Review of Systems: Changes? :No   Allergies: Patient has no known allergies.  Current Medications:  Current Outpatient Medications:    dorzolamide-timolol (COSOPT) 2-0.5 % ophthalmic solution, Place 1 drop into both eyes., Disp: , Rfl:    lisdexamfetamine (VYVANSE) 30 MG capsule, Take 1 capsule (30 mg total) by mouth daily., Disp: 30 capsule, Rfl: 0   lisdexamfetamine (VYVANSE) 40 MG capsule, Take 1 capsule (40 mg total) by mouth every morning., Disp: 30 capsule, Rfl: 0 Medication Side Effects: none  Family Medical/ Social History: Changes? No  MENTAL HEALTH EXAM:  There were no vitals taken for this visit.There is no height or weight on file to calculate BMI.  General Appearance: Casual, Neat, and Well Groomed  Eye Contact:  Good  Speech:  Clear and Coherent  Volume:  Normal  Mood:  Anxious, Depressed, and Dysphoric  Affect:  Appropriate  Thought Process:  Coherent  Orientation:  Full (Time, Place, and Person)  Thought Content: Logical   Suicidal Thoughts:  Yes.  with intent/plan  Homicidal Thoughts:  No  Memory:  WNL  Judgement:  Good  Insight:  Good  Psychomotor  Activity:  Normal  Concentration:  Concentration: Good  Recall:  Good  Fund of Knowledge: Good  Language: Good  Assets:  Desire for Improvement  ADL's:  Intact  Cognition: WNL  Prognosis:  Good    DIAGNOSES:    ICD-10-CM   1. Attention deficit hyperactivity disorder (ADHD), predominantly inattentive type  F90.0       Receiving Psychotherapy: No    RECOMMENDATIONS:   Greater than 50% of  30 min face to face time with patient was spent on counseling and coordination of care. We reviewed his medications. We discussed concerns over his continued elevated BP this visit.  He has made diet and exercise changes.BP has improved and has been running around 124/ 82. We discussed medication options and well as supplements.     To continue  Vyvanse to 30  mg daily Will report worsening condition prompty To follow up with PCP for Cholesterol and heart health regularly Will monitor BP and HR on regular basis Will follow up in 3 months to reassess  Discussed potential benefits, risks, and side effects of stimulants with patient to include increased heart rate, palpitations, insomnia, increased anxiety, increased irritability, or decreased appetite.  Instructed patient to contact office if experiencing any significant tolerability issues.  Reviewed PDMP     Joan Flores, NP

## 2022-07-03 ENCOUNTER — Other Ambulatory Visit: Payer: Self-pay | Admitting: Behavioral Health

## 2022-07-03 DIAGNOSIS — F900 Attention-deficit hyperactivity disorder, predominantly inattentive type: Secondary | ICD-10-CM

## 2022-07-03 NOTE — Telephone Encounter (Signed)
Due 6/14

## 2022-07-05 ENCOUNTER — Ambulatory Visit: Payer: BC Managed Care – PPO | Admitting: Behavioral Health

## 2022-07-31 ENCOUNTER — Other Ambulatory Visit: Payer: Self-pay | Admitting: Adult Health

## 2022-07-31 DIAGNOSIS — F900 Attention-deficit hyperactivity disorder, predominantly inattentive type: Secondary | ICD-10-CM

## 2022-07-31 NOTE — Telephone Encounter (Signed)
Due 7/12

## 2022-08-01 NOTE — Telephone Encounter (Signed)
Luke Estrada called to request refill of her vyvanse 30mg .  Appt 9/14 with Avelina Laine. Send to [facility] [place], [place] - [address]   Shouldn't this be going to [facility] vs [facility]?

## 2022-08-28 ENCOUNTER — Telehealth: Payer: Self-pay | Admitting: Behavioral Health

## 2022-08-28 ENCOUNTER — Other Ambulatory Visit: Payer: Self-pay

## 2022-08-28 DIAGNOSIS — F900 Attention-deficit hyperactivity disorder, predominantly inattentive type: Secondary | ICD-10-CM

## 2022-08-28 MED ORDER — LISDEXAMFETAMINE DIMESYLATE 30 MG PO CAPS: 30.0000 mg | ORAL_CAPSULE | Freq: Every day | ORAL | 0 refills | Status: DC

## 2022-08-28 NOTE — Telephone Encounter (Signed)
Pended for early fill date.

## 2022-08-28 NOTE — Telephone Encounter (Signed)
LF 7/15, due 8/12

## 2022-08-28 NOTE — Telephone Encounter (Signed)
Pt called at 9:37a requesting early refill of Vyvanse to    [facility] [place], [place] - [address] [place] [address] Phone: [phone]  Fax: [phone]    He said he is leaving to go to [place] on Sat and would like to pick it up on Friday.  Next appt 9/4

## 2022-09-25 ENCOUNTER — Ambulatory Visit (INDEPENDENT_AMBULATORY_CARE_PROVIDER_SITE_OTHER): Payer: 59 | Admitting: Behavioral Health

## 2022-09-25 ENCOUNTER — Encounter: Payer: Self-pay | Admitting: Behavioral Health

## 2022-09-25 ENCOUNTER — Telehealth: Payer: Self-pay | Admitting: Psychiatry

## 2022-09-25 DIAGNOSIS — F900 Attention-deficit hyperactivity disorder, predominantly inattentive type: Secondary | ICD-10-CM

## 2022-09-25 MED ORDER — JORNAY PM 40 MG PO CP24
ORAL_CAPSULE | ORAL | 0 refills | Status: AC
Start: 2022-09-25 — End: ?

## 2022-09-25 NOTE — Telephone Encounter (Signed)
[employer] sent [place] Request for [facility] 40mg  ER, see CMM key

## 2022-09-25 NOTE — Progress Notes (Signed)
[facility]  Patient ID: Luke Estrada,  MRN: [id number]  PCP: Etta Grandchild, MD  Date of Evaluation: 09/25/2022 Time spent:30 minutes  Chief Complaint:  Chief Complaint   Anxiety; Follow-up; Patient Education; Medication Refill; ADHD     HISTORY/CURRENT STATUS: HPI  37 year old male presents to this office for follow up and medication refill. He is inquiring about switching off Vyvanse due to cost. He also has to be cognitive of his BP.  Still interested in eventually weaning off medication. He is dieting and working out.   Says that his family life is going well. Follows up with PCP regularly. Request reduction or change of medication. Anxiety 1/10 and depression is 0/10. Sleeping 7-8 hours per night. No mania, or psychosis. No SI/HI.    Past Psychiatric Medication Failures: Adderall Ritalin    Individual Medical History/ Review of Systems: Changes? :No   Allergies: Patient has no known allergies.  Current Medications:  Current Outpatient Medications:    Methylphenidate HCl ER, PM, (JORNAY PM) 40 MG CP24, Take one capsule by mouth between 7-8 pm daily., Disp: 30 capsule, Rfl: 0   dorzolamide-timolol (COSOPT) 2-0.5 % ophthalmic solution, Place 1 drop into both eyes., Disp: , Rfl:    lisdexamfetamine (VYVANSE) 30 MG capsule, Take 1 capsule (30 mg total) by mouth daily., Disp: 30 capsule, Rfl: 0   lisdexamfetamine (VYVANSE) 40 MG capsule, Take 1 capsule (40 mg total) by mouth every morning., Disp: 30 capsule, Rfl: 0 Medication Side Effects: none  Family Medical/ Social History: Changes? No  MENTAL HEALTH EXAM:  There were no vitals taken for this visit.There is no height or weight on file to calculate BMI.  General Appearance: Casual, Neat, and Well Groomed  Eye Contact:  Good  Speech:  Clear and Coherent  Volume:  Normal  Mood:  NA  Affect:  Appropriate  Thought Process:  Coherent  Orientation:  Full (Time, Place, and Person)  Thought Content: Logical    Suicidal Thoughts:  No  Homicidal Thoughts:  No  Memory:  WNL  Judgement:  Good  Insight:  Good  Psychomotor Activity:  Normal  Concentration:  Concentration: Good  Recall:  Good  Fund of Knowledge: Good  Language: Good  Assets:  Desire for Improvement  ADL's:  Intact  Cognition: WNL  Prognosis:  Good    DIAGNOSES:    ICD-10-CM   1. Attention deficit hyperactivity disorder (ADHD), predominantly inattentive type  F90.0 Methylphenidate HCl ER, PM, (JORNAY PM) 40 MG CP24      Receiving Psychotherapy: No    RECOMMENDATIONS:   Greater than 50% of  30 min face to face time with patient was spent on counseling and coordination of care. We reviewed his medications. We discussed concerns over his continued elevated BP this visit.  Still following diet and exercise changes.  To stop  Vyvanse to 30  mg daily Will start Jornay 40 mg daily 7-8 pm  Will report worsening condition prompty To follow up with PCP for Cholesterol and heart health regularly Will monitor BP and HR on regular basis Will follow up in 4 weeks to reassess  Discussed potential benefits, risks, and side effects of stimulants with patient to include increased heart rate, palpitations, insomnia, increased anxiety, increased irritability, or decreased appetite.  Instructed patient to contact office if experiencing any significant tolerability issues.  Reviewed PDMP    Joan Flores, NP

## 2022-10-21 ENCOUNTER — Telehealth: Payer: Self-pay | Admitting: Behavioral Health

## 2022-10-21 NOTE — Telephone Encounter (Signed)
Pt called at 4:21p requesting refill of Journay 40mg  to   [facility] [place], [place] - [address] [place] [address] Phone: [phone]  Fax: [phone]   Next appt 10/14

## 2022-10-21 NOTE — Telephone Encounter (Signed)
LF 9/4, due 10/2

## 2022-10-23 ENCOUNTER — Other Ambulatory Visit: Payer: Self-pay

## 2022-10-23 DIAGNOSIS — F900 Attention-deficit hyperactivity disorder, predominantly inattentive type: Secondary | ICD-10-CM

## 2022-10-23 MED ORDER — JORNAY PM 40 MG PO CP24
ORAL_CAPSULE | ORAL | 0 refills | Status: DC
Start: 2022-10-23 — End: 2022-12-06

## 2022-10-23 NOTE — Telephone Encounter (Signed)
Pended.

## 2022-11-04 ENCOUNTER — Ambulatory Visit (INDEPENDENT_AMBULATORY_CARE_PROVIDER_SITE_OTHER): Payer: 59 | Admitting: Behavioral Health

## 2022-11-04 ENCOUNTER — Encounter: Payer: Self-pay | Admitting: Behavioral Health

## 2022-11-04 DIAGNOSIS — F900 Attention-deficit hyperactivity disorder, predominantly inattentive type: Secondary | ICD-10-CM

## 2022-11-04 MED ORDER — JORNAY PM 60 MG PO CP24
ORAL_CAPSULE | ORAL | 0 refills | Status: DC
Start: 2022-11-04 — End: 2022-12-06

## 2022-11-04 NOTE — Progress Notes (Signed)
[facility]  Patient ID: HILDRETH ROBART,  MRN: [id number]  PCP: Etta Grandchild, MD  Date of Evaluation: 11/04/2022 Time spent:30 minutes  Chief Complaint:  Chief Complaint   ADHD; Follow-up; Medication Refill; Patient Education; Medication Problem     HISTORY/CURRENT STATUS: HPI  37 year old male presents to this office for follow up and medication refill. He says Ophelia Charter has been working very well but he would like to consider increasing his dose this visit due to medication not working well in the afternoon. He also has to be cognitive of his BP.  Still interested in eventually weaning off medication. He is dieting and working out.   Says that his family life is going well. Follows up with PCP regularly. Request reduction or change of medication. Anxiety 1/10 and depression is 0/10. Sleeping 7-8 hours per night. No mania, or psychosis. No SI/HI.    Past Psychiatric Medication Failures: Adderall Ritalin      Individual Medical History/ Review of Systems: Changes? :No   Allergies: Patient has no known allergies.  Current Medications:  Current Outpatient Medications:    Methylphenidate HCl ER, PM, (JORNAY PM) 60 MG CP24, Take one capsule by mouth 7-8 pm nightly., Disp: 30 capsule, Rfl: 0   dorzolamide-timolol (COSOPT) 2-0.5 % ophthalmic solution, Place 1 drop into both eyes., Disp: , Rfl:    Methylphenidate HCl ER, PM, (JORNAY PM) 40 MG CP24, Take one capsule by mouth between 7-8 pm daily., Disp: 30 capsule, Rfl: 0 Medication Side Effects: none  Family Medical/ Social History: Changes? No  MENTAL HEALTH EXAM:  There were no vitals taken for this visit.There is no height or weight on file to calculate BMI.  General Appearance: Casual, Neat, and Well Groomed  Eye Contact:  Good  Speech:  Clear and Coherent  Volume:  Normal  Mood:  NA  Affect:  Appropriate  Thought Process:  Coherent  Orientation:  Full (Time, Place, and Person)  Thought Content: Logical    Suicidal Thoughts:  No  Homicidal Thoughts:  No  Memory:  WNL  Judgement:  Good  Insight:  Good  Psychomotor Activity:  Normal  Concentration:  Concentration: Good  Recall:  Good  Fund of Knowledge: Good  Language: Good  Assets:  Desire for Improvement  ADL's:  Intact  Cognition: WNL  Prognosis:  Good    DIAGNOSES:    ICD-10-CM   1. Attention deficit hyperactivity disorder (ADHD), predominantly inattentive type  F90.0 Methylphenidate HCl ER, PM, (JORNAY PM) 60 MG CP24      Receiving Psychotherapy: No    RECOMMENDATIONS:  Greater than 50% of  30 min face to face time with patient was spent on counseling and coordination of care. We reviewed his medications. We discussed concerns over his continued elevated BP this visit.  Still following diet and exercise changes.   To increase Jornay to 60  mg daily 7-8 pm  Will report worsening condition prompty To follow up with PCP for Cholesterol and heart health regularly Will monitor BP and HR on regular basis Will follow up in 12 weeks to reassess  Discussed potential benefits, risks, and side effects of stimulants with patient to include increased heart rate, palpitations, insomnia, increased anxiety, increased irritability, or decreased appetite.  Instructed patient to contact office if experiencing any significant tolerability issues.  Reviewed PDMP  Joan Flores, NP

## 2022-12-06 ENCOUNTER — Telehealth: Payer: Self-pay | Admitting: Behavioral Health

## 2022-12-06 ENCOUNTER — Other Ambulatory Visit: Payer: Self-pay

## 2022-12-06 DIAGNOSIS — F900 Attention-deficit hyperactivity disorder, predominantly inattentive type: Secondary | ICD-10-CM

## 2022-12-06 MED ORDER — JORNAY PM 60 MG PO CP24
ORAL_CAPSULE | ORAL | 0 refills | Status: DC
Start: 2022-12-06 — End: 2023-01-03

## 2022-12-06 NOTE — Telephone Encounter (Signed)
Pended.

## 2022-12-06 NOTE — Telephone Encounter (Signed)
Patient called in for refill on Journay 60mg . PH: [phone] Appt 1/6 Pharmacy [facility] [address] [place], [place]

## 2022-12-23 NOTE — Telephone Encounter (Signed)
Pt on different dose

## 2023-01-03 ENCOUNTER — Other Ambulatory Visit: Payer: Self-pay | Admitting: Behavioral Health

## 2023-01-03 DIAGNOSIS — F900 Attention-deficit hyperactivity disorder, predominantly inattentive type: Secondary | ICD-10-CM

## 2023-01-27 ENCOUNTER — Encounter: Payer: Self-pay | Admitting: Behavioral Health

## 2023-01-27 ENCOUNTER — Ambulatory Visit: Payer: 59 | Admitting: Behavioral Health

## 2023-01-27 DIAGNOSIS — F900 Attention-deficit hyperactivity disorder, predominantly inattentive type: Secondary | ICD-10-CM | POA: Diagnosis not present

## 2023-01-27 MED ORDER — JORNAY PM 60 MG PO CP24
ORAL_CAPSULE | ORAL | 0 refills | Status: DC
Start: 2023-02-05 — End: 2023-03-05

## 2023-01-27 NOTE — Progress Notes (Signed)
 [facility]  Patient ID: Luke Estrada,  MRN: [id number]  PCP: Joshua Debby CROME, MD  Date of Evaluation: 01/27/2023 Time spent:30 minutes  Chief Complaint:  Chief Complaint   ADHD; Anxiety; Follow-up; Patient Education; Medication Refill     HISTORY/CURRENT STATUS: HPI  38 year old male presents to this office for follow up and medication refill. Very happy with Elissa this visit. Requesting no med changes. He is dieting and working out.   Says that his family life is going well. Follows up with PCP regularly. Request reduction or change of medication. Anxiety 1/10 and depression is 0/10. Sleeping 7-8 hours per night. No mania, or psychosis. No SI/HI.    Past Psychiatric Medication Failures: Adderall Ritalin           Individual Medical History/ Review of Systems: Changes? :No   Allergies: Patient has no known allergies.  Current Medications:  Current Outpatient Medications:    dorzolamide-timolol (COSOPT) 2-0.5 % ophthalmic solution, Place 1 drop into both eyes., Disp: , Rfl:    [START ON 02/05/2023] Methylphenidate  HCl ER, PM, (JORNAY PM ) 60 MG CP24, Take one capsule by mouth 7-8 pm nightly., Disp: 30 capsule, Rfl: 0 Medication Side Effects: none  Family Medical/ Social History: Changes? No  MENTAL HEALTH EXAM:  There were no vitals taken for this visit.There is no height or weight on file to calculate BMI.  General Appearance: Casual, Neat, and Well Groomed  Eye Contact:  Good  Speech:  Clear and Coherent  Volume:  Normal  Mood:  NA  Affect:  Appropriate  Thought Process:  Coherent  Orientation:  Full (Time, Place, and Person)  Thought Content: Logical   Suicidal Thoughts:  No  Homicidal Thoughts:  No  Memory:  WNL  Judgement:  Good  Insight:  Good  Psychomotor Activity:  Normal  Concentration:  Concentration: Good  Recall:  Good  Fund of Knowledge: Good  Language: Good  Assets:  Desire for Improvement  ADL's:  Intact  Cognition: WNL   Prognosis:  Good    DIAGNOSES:    ICD-10-CM   1. Attention deficit hyperactivity disorder (ADHD), predominantly inattentive type  F90.0 Methylphenidate  HCl ER, PM, (JORNAY PM ) 60 MG CP24      Receiving Psychotherapy: No    RECOMMENDATIONS:  Greater than 50% of  30 min face to face time with patient was spent on counseling and coordination of care. We reviewed his medications. He is doing very well on Jornay and no medication adjustments indicated.   To continue Jornay to 60  mg daily 7-8 pm  Will report worsening condition prompty To follow up with PCP for Cholesterol and heart health regularly Will monitor BP and HR on regular basis Will follow up in 12 weeks to reassess  Discussed potential benefits, risks, and side effects of stimulants with patient to include increased heart rate, palpitations, insomnia, increased anxiety, increased irritability, or decreased appetite.  Instructed patient to contact office if experiencing any significant tolerability issues.  Reviewed PDMP        Redell DELENA Pizza, NP

## 2023-03-05 ENCOUNTER — Other Ambulatory Visit: Payer: Self-pay | Admitting: Behavioral Health

## 2023-03-05 DIAGNOSIS — F900 Attention-deficit hyperactivity disorder, predominantly inattentive type: Secondary | ICD-10-CM

## 2023-03-06 ENCOUNTER — Ambulatory Visit (INDEPENDENT_AMBULATORY_CARE_PROVIDER_SITE_OTHER): Payer: Managed Care, Other (non HMO) | Admitting: Internal Medicine

## 2023-03-06 ENCOUNTER — Encounter: Payer: Self-pay | Admitting: Internal Medicine

## 2023-03-06 VITALS — BP 162/116 | HR 83 | Temp 99.0°F | Resp 16 | Ht 71.0 in | Wt 172.2 lb

## 2023-03-06 DIAGNOSIS — R7989 Other specified abnormal findings of blood chemistry: Secondary | ICD-10-CM | POA: Diagnosis not present

## 2023-03-06 DIAGNOSIS — Z23 Encounter for immunization: Secondary | ICD-10-CM

## 2023-03-06 DIAGNOSIS — I10 Essential (primary) hypertension: Secondary | ICD-10-CM | POA: Diagnosis not present

## 2023-03-06 DIAGNOSIS — Z0001 Encounter for general adult medical examination with abnormal findings: Secondary | ICD-10-CM

## 2023-03-06 DIAGNOSIS — Z0000 Encounter for general adult medical examination without abnormal findings: Secondary | ICD-10-CM | POA: Diagnosis not present

## 2023-03-06 LAB — CBC WITH DIFFERENTIAL/PLATELET
Basophils Absolute: 0 10*3/uL (ref 0.0–0.1)
Basophils Relative: 0.3 % (ref 0.0–3.0)
Eosinophils Absolute: 0 10*3/uL (ref 0.0–0.7)
Eosinophils Relative: 0.3 % (ref 0.0–5.0)
HCT: 49 % (ref 39.0–52.0)
Hemoglobin: 16.6 g/dL (ref 13.0–17.0)
Lymphocytes Relative: 26.8 % (ref 12.0–46.0)
Lymphs Abs: 1.8 10*3/uL (ref 0.7–4.0)
MCHC: 33.8 g/dL (ref 30.0–36.0)
MCV: 95.4 fL (ref 78.0–100.0)
Monocytes Absolute: 0.6 10*3/uL (ref 0.1–1.0)
Monocytes Relative: 8.8 % (ref 3.0–12.0)
Neutro Abs: 4.2 10*3/uL (ref 1.4–7.7)
Neutrophils Relative %: 63.8 % (ref 43.0–77.0)
Platelets: 273 10*3/uL (ref 150.0–400.0)
RBC: 5.14 Mil/uL (ref 4.22–5.81)
RDW: 12.8 % (ref 11.5–15.5)
WBC: 6.7 10*3/uL (ref 4.0–10.5)

## 2023-03-06 LAB — BASIC METABOLIC PANEL
BUN: 11 mg/dL (ref 6–23)
CO2: 30 meq/L (ref 19–32)
Calcium: 9.8 mg/dL (ref 8.4–10.5)
Chloride: 102 meq/L (ref 96–112)
Creatinine, Ser: 1.07 mg/dL (ref 0.40–1.50)
GFR: 87.08 mL/min (ref >60.00)
Glucose, Bld: 101 mg/dL — ABNORMAL HIGH (ref 70–99)
Potassium: 4.9 meq/L (ref 3.5–5.1)
Sodium: 142 meq/L (ref 135–145)

## 2023-03-06 LAB — TSH: TSH: 1.17 u[IU]/mL (ref 0.35–5.50)

## 2023-03-06 LAB — LIPID PANEL
Cholesterol: 188 mg/dL (ref 0–200)
HDL: 35.4 mg/dL — ABNORMAL LOW (ref >39.00)
LDL Cholesterol: 123 mg/dL — ABNORMAL HIGH (ref 0–99)
NonHDL: 152.64
Total CHOL/HDL Ratio: 5
Triglycerides: 148 mg/dL (ref 0.0–149.0)
VLDL: 29.6 mg/dL (ref 0.0–40.0)

## 2023-03-06 LAB — HEPATIC FUNCTION PANEL
ALT: 19 U/L (ref 0–53)
AST: 23 U/L (ref 0–37)
Albumin: 5.4 g/dL — ABNORMAL HIGH (ref 3.5–5.2)
Alkaline Phosphatase: 70 U/L (ref 39–117)
Bilirubin, Direct: 0.1 mg/dL (ref 0.0–0.3)
Total Bilirubin: 1.2 mg/dL (ref 0.2–1.2)
Total Protein: 8.3 g/dL (ref 6.0–8.3)

## 2023-03-06 NOTE — Progress Notes (Addendum)
 Subjective:  Patient ID: Luke Estrada, male    DOB: 1985-07-14  Age: 38 y.o. MRN: [id number]  CC: Annual Exam and Hypertension   HPI Luke Estrada presents for a CPX and f/up ----  Discussed the use of AI scribe software for clinical note transcription with the patient, who gave verbal consent to proceed.  History of Present Illness   Luke Estrada is a 38 year old male who presents with concerns about heart health and high blood pressure.  He is motivated to make lifestyle changes after his daughter commented on his weight. He has reduced alcohol intake and adjusted his diet to include more meat, vegetables, and fruit, while reducing carbohydrates, sugar, and dairy. He reports a weight loss of ten pounds and feels good overall.  No cardiac symptoms such as chest pain or shortness of breath. He mentions a recent stressor related to potential job loss, which he believes may be affecting his blood pressure. His blood pressure readings have been on the borderline, with a recent reading of 164/112. He has a family history of high blood pressure, though no strokes or heart attacks have occurred in his family.  He is taking methylphenidate for ADHD and has experienced issues when consuming alcohol, such as heart racing and difficulty sleeping. He has significantly reduced his alcohol consumption, noting improved sleep quality when he drinks less. His last alcohol consumption was four to five days ago, and he no longer engages in weekend binges.  He has not been vaccinated against influenza and his last tetanus shot was approximately ten years ago.       Outpatient Medications Prior to Visit  Medication Sig Dispense Refill   dorzolamide-timolol (COSOPT) 2-0.5 % ophthalmic solution Place 1 drop into both eyes.     Methylphenidate HCl ER, PM, (JORNAY PM) 60 MG CP24 Take one capsule by mouth at 7-8 pm nightly. 30 capsule 0   [START ON 04/02/2023] Methylphenidate HCl ER, PM, (JORNAY PM) 60 MG CP24  Take one capsule by mouth at 7-8 pm nightly. (Patient not taking: Reported on 03/06/2023) 30 capsule 0   [START ON 04/30/2023] Methylphenidate HCl ER, PM, (JORNAY PM) 60 MG CP24 Take one capsule by mouth at 7-8 pm nightly. (Patient not taking: Reported on 03/06/2023) 30 capsule 0   No facility-administered medications prior to visit.    ROS Review of Systems  Constitutional: Negative.  Negative for diaphoresis, fatigue and fever.  HENT: Negative.    Eyes: Negative.   Respiratory: Negative.  Negative for cough, chest tightness, shortness of breath and wheezing.   Cardiovascular:  Negative for chest pain, palpitations and leg swelling.  Gastrointestinal: Negative.  Negative for abdominal pain, constipation, diarrhea, nausea and vomiting.  Endocrine: Negative.   Genitourinary: Negative.  Negative for difficulty urinating and hematuria.  Musculoskeletal: Negative.  Negative for arthralgias, joint swelling and myalgias.  Skin: Negative.  Negative for color change.  Neurological: Negative.  Negative for dizziness and weakness.  Hematological:  Negative for adenopathy. Does not bruise/bleed easily.  Psychiatric/Behavioral:  Positive for decreased concentration. Negative for behavioral problems, confusion, dysphoric mood, hallucinations, self-injury, sleep disturbance and suicidal ideas. The patient is nervous/anxious.     Objective:  BP (!) 162/116 (BP Location: Left Arm, Cuff Size: Normal) Comment: BP (R) 164/112  Pulse 83   Temp 99 F (37.2 C) (Temporal)   Resp 16   Ht 5\' 11"  (1.803 m)   Wt 172 lb 4 oz (78.1 kg)   SpO2 97%  BMI 24.02 kg/m   BP Readings from Last 3 Encounters:  03/06/23 (!) 162/116  02/27/22 (!) 148/96  08/29/21 (!) 152/109    Wt Readings from Last 3 Encounters:  03/06/23 172 lb 4 oz (78.1 kg)  02/27/22 172 lb (78 kg)  03/16/20 172 lb (78 kg)    Physical Exam Vitals reviewed.  Constitutional:      Appearance: Normal appearance. He is not ill-appearing.   HENT:     Nose: Nose normal.     Mouth/Throat:     Mouth: Mucous membranes are moist.  Eyes:     General: No scleral icterus.    Conjunctiva/sclera: Conjunctivae normal.  Cardiovascular:     Rate and Rhythm: Normal rate and regular rhythm.     Pulses: Normal pulses.     Heart sounds: Normal heart sounds, S1 normal and S2 normal. No murmur heard.    No friction rub. No gallop.     Comments: EKG --- NSR, 75 bpm No LVH, Q waves, or ST/T wave changes  Pulmonary:     Effort: Pulmonary effort is normal.     Breath sounds: No stridor. No wheezing, rhonchi or rales.  Abdominal:     General: Abdomen is flat.     Palpations: There is no mass.     Tenderness: There is no abdominal tenderness. There is no guarding.     Hernia: No hernia is present.  Musculoskeletal:        General: Normal range of motion.     Cervical back: Neck supple.     Right lower leg: No edema.     Left lower leg: No edema.  Lymphadenopathy:     Cervical: No cervical adenopathy.  Skin:    General: Skin is warm and dry.  Neurological:     General: No focal deficit present.     Mental Status: He is alert. Mental status is at baseline.  Psychiatric:        Attention and Perception: Attention normal.        Mood and Affect: Mood is anxious. Mood is not depressed. Affect is not labile.        Speech: Speech normal.        Behavior: Behavior normal. Behavior is cooperative.        Thought Content: Thought content normal. Thought content is not paranoid or delusional. Thought content does not include homicidal or suicidal ideation.        Cognition and Memory: Cognition normal.     Lab Results  Component Value Date   WBC 6.7 03/06/2023   HGB 16.6 03/06/2023   HCT 49.0 03/06/2023   PLT 273.0 03/06/2023   GLUCOSE 101 (H) 03/06/2023   CHOL 188 03/06/2023   TRIG 148.0 03/06/2023   HDL 35.40 (L) 03/06/2023   LDLCALC 123 (H) 03/06/2023   ALT 19 03/06/2023   AST 23 03/06/2023   NA 142 03/06/2023   K 4.9  03/06/2023   CL 102 03/06/2023   CREATININE 1.07 03/06/2023   BUN 11 03/06/2023   CO2 30 03/06/2023   TSH 1.17 03/06/2023   INR 1.1 (H) 03/16/2020   HGBA1C 5.3 10/12/2012    No results found.  Assessment & Plan:   Encounter for general adult medical examination with abnormal findings- Exam completed, labs reviewed, vaccines reviewed and updated, no cancer screenings indicated, pt ed material was given.  -     Lipid panel; Future  Hypertension, unspecified type- EKG is negative for LVH.  Will  check labs to screen for secondary causes and endorgan damage.  Will start the combination of a thiazide diuretic, calcium channel blocker, and ARB.  I have asked him to stop taking the methamphetamine. -     TSH; Future -     Urinalysis, Routine w reflex microscopic; Future -     Hepatic function panel; Future -     Aldosterone + renin activity w/ ratio; Future -     Basic metabolic panel; Future -     CBC with Differential/Platelet; Future -     amLODIPine-Olmesartan; Take 1 tablet by mouth daily.  Dispense: 90 tablet; Refill: 0 -     Indapamide; Take 1 tablet (1.25 mg total) by mouth daily.  Dispense: 90 tablet; Refill: 0  Elevated LFTs -     Hepatic function panel; Future  Need for vaccination -     Tdap vaccine greater than or equal to 7yo IM     Follow-up: Return in about 3 months (around 06/03/2023).  Sanda Linger, MD

## 2023-03-06 NOTE — Patient Instructions (Signed)
Health Maintenance, Male  Adopting a healthy lifestyle and getting preventive care are important in promoting health and wellness. Ask your health care provider about:  The right schedule for you to have regular tests and exams.  Things you can do on your own to prevent diseases and keep yourself healthy.  What should I know about diet, weight, and exercise?  Eat a healthy diet    Eat a diet that includes plenty of vegetables, fruits, low-fat dairy products, and lean protein.  Do not eat a lot of foods that are high in solid fats, added sugars, or sodium.  Maintain a healthy weight  Body mass index (BMI) is a measurement that can be used to identify possible weight problems. It estimates body fat based on height and weight. Your health care provider can help determine your BMI and help you achieve or maintain a healthy weight.  Get regular exercise  Get regular exercise. This is one of the most important things you can do for your health. Most adults should:  Exercise for at least 150 minutes each week. The exercise should increase your heart rate and make you sweat (moderate-intensity exercise).  Do strengthening exercises at least twice a week. This is in addition to the moderate-intensity exercise.  Spend less time sitting. Even light physical activity can be beneficial.  Watch cholesterol and blood lipids  Have your blood tested for lipids and cholesterol at 40 years of age, then have this test every 5 years.  You may need to have your cholesterol levels checked more often if:  Your lipid or cholesterol levels are high.  You are older than 40 years of age.  You are at high risk for heart disease.  What should I know about cancer screening?  Many types of cancers can be detected early and may often be prevented. Depending on your health history and family history, you may need to have cancer screening at various ages. This may include screening for:  Colorectal cancer.  Prostate cancer.  Skin cancer.  Lung  cancer.  What should I know about heart disease, diabetes, and high blood pressure?  Blood pressure and heart disease  High blood pressure causes heart disease and increases the risk of stroke. This is more likely to develop in people who have high blood pressure readings or are overweight.  Talk with your health care provider about your target blood pressure readings.  Have your blood pressure checked:  Every 3-5 years if you are 9-95 years of age.  Every year if you are 85 years old or older.  If you are between the ages of 29 and 29 and are a current or former smoker, ask your health care provider if you should have a one-time screening for abdominal aortic aneurysm (AAA).  Diabetes  Have regular diabetes screenings. This checks your fasting blood sugar level. Have the screening done:  Once every three years after age 23 if you are at a normal weight and have a low risk for diabetes.  More often and at a younger age if you are overweight or have a high risk for diabetes.  What should I know about preventing infection?  Hepatitis B  If you have a higher risk for hepatitis B, you should be screened for this virus. Talk with your health care provider to find out if you are at risk for hepatitis B infection.  Hepatitis C  Blood testing is recommended for:  Everyone born from [birth year] through 1967.  Anyone  with known risk factors for hepatitis C.  Sexually transmitted infections (STIs)  You should be screened each year for STIs, including gonorrhea and chlamydia, if:  You are sexually active and are younger than 40 years of age.  You are older than 40 years of age and your health care provider tells you that you are at risk for this type of infection.  Your sexual activity has changed since you were last screened, and you are at increased risk for chlamydia or gonorrhea. Ask your health care provider if you are at risk.  Ask your health care provider about whether you are at high risk for HIV. Your health care provider  may recommend a prescription medicine to help prevent HIV infection. If you choose to take medicine to prevent HIV, you should first get tested for HIV. You should then be tested every 3 months for as long as you are taking the medicine.  Follow these instructions at home:  Alcohol use  Do not drink alcohol if your health care provider tells you not to drink.  If you drink alcohol:  Limit how much you have to 0-2 drinks a day.  Know how much alcohol is in your drink. In the [place] one drink equals one 12 oz bottle of beer (355 mL), one 5 oz glass of wine (148 mL), or one 1 oz glass of hard liquor (44 mL).  Lifestyle  Do not use any products that contain nicotine or tobacco. These products include cigarettes, chewing tobacco, and vaping devices, such as e-cigarettes. If you need help quitting, ask your health care provider.  Do not use street drugs.  Do not share needles.  Ask your health care provider for help if you need support or information about quitting drugs.  General instructions  Schedule regular health, dental, and eye exams.  Stay current with your vaccines.  Tell your health care provider if:  You often feel depressed.  You have ever been abused or do not feel safe at home.  Summary  Adopting a healthy lifestyle and getting preventive care are important in promoting health and wellness.  Follow your health care provider's instructions about healthy diet, exercising, and getting tested or screened for diseases.  Follow your health care provider's instructions on monitoring your cholesterol and blood pressure.  This information is not intended to replace advice given to you by your health care provider. Make sure you discuss any questions you have with your health care provider.  Document Revised: 05/29/2020 Document Reviewed: 05/29/2020  Elsevier Patient Education  2024 [employer].

## 2023-03-07 ENCOUNTER — Encounter: Payer: Self-pay | Admitting: Internal Medicine

## 2023-03-07 LAB — URINALYSIS, ROUTINE W REFLEX MICROSCOPIC
Bilirubin Urine: NEGATIVE
Hgb urine dipstick: NEGATIVE
Ketones, ur: NEGATIVE
Leukocytes,Ua: NEGATIVE
Nitrite: NEGATIVE
RBC / HPF: NONE SEEN (ref 0–?)
Specific Gravity, Urine: <=1.005 — AB (ref 1.000–1.030)
Total Protein, Urine: NEGATIVE
Urine Glucose: NEGATIVE
Urobilinogen, UA: 0.2 (ref 0.0–1.0)
pH: 6 (ref 5.0–8.0)

## 2023-03-07 MED ORDER — INDAPAMIDE 1.25 MG PO TABS: 1.2500 mg | ORAL_TABLET | Freq: Every day | ORAL | 0 refills | Status: DC

## 2023-03-07 MED ORDER — AMLODIPINE-OLMESARTAN 5-20 MG PO TABS: 1.0000 | ORAL_TABLET | Freq: Every day | ORAL | 0 refills | Status: AC

## 2023-03-11 ENCOUNTER — Other Ambulatory Visit: Payer: Self-pay | Admitting: Internal Medicine

## 2023-03-11 DIAGNOSIS — I10 Essential (primary) hypertension: Secondary | ICD-10-CM

## 2023-03-11 LAB — ALDOSTERONE + RENIN ACTIVITY W/ RATIO
ALDO / PRA Ratio: 5.8 {ratio} (ref 0.9–28.9)
Aldosterone: 5 ng/dL
Renin Activity: 0.86 ng/mL/h (ref 0.25–5.82)

## 2023-03-11 NOTE — Addendum Note (Signed)
 Addended by: Daryll Brod on: 03/11/2023 07:36 AM   Modules accepted: Orders

## 2023-04-09 ENCOUNTER — Encounter: Payer: Self-pay | Admitting: Physician Assistant

## 2023-04-09 ENCOUNTER — Telehealth: Admitting: Physician Assistant

## 2023-04-09 DIAGNOSIS — B360 Pityriasis versicolor: Secondary | ICD-10-CM | POA: Diagnosis not present

## 2023-04-09 MED ORDER — FLUCONAZOLE 150 MG PO TABS: 150.0000 mg | ORAL_TABLET | ORAL | 0 refills | Status: DC

## 2023-04-09 MED ORDER — PREDNISONE 10 MG (21) PO TBPK: ORAL_TABLET | ORAL | 0 refills | Status: DC

## 2023-04-09 MED ORDER — TRIAMCINOLONE ACETONIDE 0.1 % EX CREA: 1.0000 | TOPICAL_CREAM | Freq: Two times a day (BID) | CUTANEOUS | 0 refills | Status: DC

## 2023-04-09 MED ORDER — FLUCONAZOLE 150 MG PO TABS
150.0000 mg | ORAL_TABLET | ORAL | 0 refills | Status: DC
Start: 2023-04-09 — End: 2023-06-18

## 2023-04-09 NOTE — Progress Notes (Signed)
 Virtual Visit Consent   [facility], you are scheduled for a virtual visit with a [facility] provider today. Just as with appointments in the office, your consent must be obtained to participate. Your consent will be active for this visit and any virtual visit you may have with one of our providers in the next 365 days. If you have a MyChart account, a copy of this consent can be sent to you electronically.  As this is a virtual visit, video technology does not allow for your provider to perform a traditional examination. This may limit your provider's ability to fully assess your condition. If your provider identifies any concerns that need to be evaluated in person or the need to arrange testing (such as labs, EKG, etc.), we will make arrangements to do so. Although advances in technology are sophisticated, we cannot ensure that it will always work on either your end or our end. If the connection with a video visit is poor, the visit may have to be switched to a telephone visit. With either a video or telephone visit, we are not always able to ensure that we have a secure connection.  By engaging in this virtual visit, you consent to the provision of healthcare and authorize for your insurance to be billed (if applicable) for the services provided during this visit. Depending on your insurance coverage, you may receive a charge related to this service.  I need to obtain your verbal consent now. Are you willing to proceed with your visit today? Luke Estrada has provided verbal consent on 04/09/2023 for a virtual visit (video or telephone). Piedad Climes, [place]  Date: 04/09/2023 9:27 AM   Virtual Visit via Video Note   I, Piedad Climes, connected with  Luke Estrada  ([id number], Jan 15, 1986) on 04/09/23 at  9:00 AM EDT by a video-enabled telemedicine application and verified that I am speaking with the correct person using two identifiers.  Location: Patient: Virtual Visit Location  Patient: Home Provider: Virtual Visit Location Provider: Home Office   I discussed the limitations of evaluation and management by telemedicine and the availability of in person appointments. The patient expressed understanding and agreed to proceed.    History of Present Illness: Luke Estrada is a 38 y.o. who identifies as a male who was assigned male at birth, and is being seen today for a rash that has been present for over a week. Located underneath Left arm and on thighs bilaterally, now spread to his waist. Is somewhat irritated and itchy but not painful. Denies recent travel. No sick contacts. Denies fever, chills, malaise or fatigue. Recently changed soap so unsure if related, but he did stop use of this.   HPI: HPI  Problems:  Patient Active Problem List   Diagnosis Date Noted   Hypertension 03/17/2020   Glaucoma of both eyes 03/17/2020   Encounter for general adult medical examination with abnormal findings 03/16/2020   Elevated LFTs 03/16/2020   ADHD (attention deficit hyperactivity disorder) 10/12/2012    Allergies: No Known Allergies Medications:  Current Outpatient Medications:    fluconazole (DIFLUCAN) 150 MG tablet, Take 1 tablet (150 mg total) by mouth once a week., Disp: 3 tablet, Rfl: 0   triamcinolone cream (KENALOG) 0.1 %, Apply 1 Application topically 2 (two) times daily., Disp: 30 g, Rfl: 0   amLODipine-olmesartan (AZOR) 5-20 MG tablet, Take 1 tablet by mouth daily., Disp: 90 tablet, Rfl: 0   dorzolamide-timolol (COSOPT) 2-0.5 % ophthalmic solution,  Place 1 drop into both eyes., Disp: , Rfl:    indapamide (LOZOL) 1.25 MG tablet, Take 1 tablet (1.25 mg total) by mouth daily., Disp: 90 tablet, Rfl: 0   Methylphenidate HCl ER, PM, (JORNAY PM) 60 MG CP24, Take one capsule by mouth at 7-8 pm nightly., Disp: 30 capsule, Rfl: 0   Methylphenidate HCl ER, PM, (JORNAY PM) 60 MG CP24, Take one capsule by mouth at 7-8 pm nightly. (Patient not taking: Reported on 03/06/2023),  Disp: 30 capsule, Rfl: 0   [START ON 04/30/2023] Methylphenidate HCl ER, PM, (JORNAY PM) 60 MG CP24, Take one capsule by mouth at 7-8 pm nightly. (Patient not taking: Reported on 03/06/2023), Disp: 30 capsule, Rfl: 0  Observations/Objective: Patient is well-developed, well-nourished in no acute distress.  Resting comfortably  at home.  Head is normocephalic, atraumatic.  No labored breathing.  Speech is clear and coherent with logical content.  Patient is alert and oriented at baseline.          Assessment and Plan: 1. Tinea versicolor (Primary) - triamcinolone cream (KENALOG) 0.1 %; Apply 1 Application topically 2 (two) times daily.  Dispense: 30 g; Refill: 0 - fluconazole (DIFLUCAN) 150 MG tablet; Take 1 tablet (150 mg total) by mouth once a week.  Dispense: 3 tablet; Refill: 0  Will do combination of oral diflucan giving severity, coupled with topical triamcinolone for more bothersome areas of itching. OTC clotrimazole as directed.  Follow Up Instructions: I discussed the assessment and treatment plan with the patient. The patient was provided an opportunity to ask questions and all were answered. The patient agreed with the plan and demonstrated an understanding of the instructions.  A copy of instructions were sent to the patient via MyChart unless otherwise noted below.   The patient was advised to call back or seek an in-person evaluation if the symptoms worsen or if the condition fails to improve as anticipated.    Piedad Climes, PA-C

## 2023-04-09 NOTE — Patient Instructions (Signed)
 Severiano Gilbert, thank you for joining Piedad Climes, PA-C for today's virtual visit.  While this provider is not your primary care provider (PCP), if your PCP is located in our provider database this encounter information will be shared with them immediately following your visit.   A [facility] MyChart account gives you access to today's visit and all your visits, tests, and labs performed at [facility] " click here if you don't have a [facility] MyChart account or go to [email or web address]  Consent: (Patient) Luke Estrada provided verbal consent for this virtual visit at the beginning of the encounter.  Current Medications:  Current Outpatient Medications:    amLODipine-olmesartan (AZOR) 5-20 MG tablet, Take 1 tablet by mouth daily., Disp: 90 tablet, Rfl: 0   dorzolamide-timolol (COSOPT) 2-0.5 % ophthalmic solution, Place 1 drop into both eyes., Disp: , Rfl:    indapamide (LOZOL) 1.25 MG tablet, Take 1 tablet (1.25 mg total) by mouth daily., Disp: 90 tablet, Rfl: 0   Methylphenidate HCl ER, PM, (JORNAY PM) 60 MG CP24, Take one capsule by mouth at 7-8 pm nightly., Disp: 30 capsule, Rfl: 0   Methylphenidate HCl ER, PM, (JORNAY PM) 60 MG CP24, Take one capsule by mouth at 7-8 pm nightly. (Patient not taking: Reported on 03/06/2023), Disp: 30 capsule, Rfl: 0   [START ON 04/30/2023] Methylphenidate HCl ER, PM, (JORNAY PM) 60 MG CP24, Take one capsule by mouth at 7-8 pm nightly. (Patient not taking: Reported on 03/06/2023), Disp: 30 capsule, Rfl: 0   Medications ordered in this encounter:  No orders of the defined types were placed in this encounter.    *If you need refills on other medications prior to your next appointment, please contact your pharmacy*  Follow-Up: Call back or seek an in-person evaluation if the symptoms worsen or if the condition fails to improve as anticipated.  [facility] [phone]  Other Instructions Tinea Versicolor  Tinea  versicolor is a skin infection. It is caused by a type of yeast. It is normal for some yeast to be on your skin, but too much yeast causes this infection. The infection causes a rash of light or dark patches on your skin. The rash is most common on the chest, back, neck, or upper arms. The infection usually does not cause other problems. If it is treated, it will probably go away in a few weeks. The infection cannot be spread from one person to another (is notcontagious). What are the causes? This condition is caused by a certain type of yeast that starts to grow too much on your skin. What increases the risk? Heat and humidity. Sweating too much. Hormone changes. This may happen when taking birth control pills. Oily skin. A weak disease-fighting system (immunesystem). What are the signs or symptoms? A rash of light or dark patches on your skin. The rash may have: Patches of tan or pink spots (on light skin). Patches of white or brown spots (on dark skin). Patches of skin that do not tan. Well-marked edges. Scales. Mild itching. There may also be no itching. How is this treated? Treatment for this condition may include: Dandruff shampoo. The shampoo may be used on the affected skin during showers or baths. Over-the-counter medicated skin cream, lotion, or soaps. Prescription antifungal medicine. This may include cream or pills. Medicine to help your itching. Follow these instructions at home: Use over-the-counter and prescription medicines only as told by your doctor. Wash your skin with dandruff shampoo  as told by your doctor. Do not scratch your skin in the rash area. Avoid places that are hot and humid. Do not use tanning booths. Try to avoid sweating a lot. Contact a doctor if: Your symptoms get worse. You have a fever. You have signs of infection such as: Redness, swelling, or pain in the rash area. Warmth coming from your rash. Fluid or blood coming from your rash. Pus or  a bad smell coming from your rash. Your rash comes back (recurs) after treatment. Your rash does not improve with treatment. Your rash spreads to other parts of the body. Summary Tinea versicolor is a skin infection. It causes a rash of light or dark patches on your skin. The rash is most common on the chest, back, neck, or upper arms. This infection usually does not cause other problems. Use over-the-counter and prescription medicines only as told by your doctor. If the infection is treated, it will probably go away in a few weeks. This information is not intended to replace advice given to you by your health care provider. Make sure you discuss any questions you have with your health care provider. Document Revised: 03/28/2020 Document Reviewed: 03/28/2020 Elsevier Patient Education  2024 [employer]   If you have been instructed to have an in-person evaluation today at a local Urgent Care facility, please use the link below. It will take you to a list of all of our available [facility], including address, phone number and hours of operation. Please do not delay care.  [facility]  If you or a family member do not have a primary care provider, use the link below to schedule a visit and establish care. When you choose a [facility] primary care physician or advanced practice provider, you gain a long-term partner in health. Find a Primary Care Provider  Learn more about [facility]'s in-office and virtual care options: [facility] Get Care Now

## 2023-04-23 NOTE — Progress Notes (Unsigned)
   Acute Office Visit  Subjective:     Patient ID: Luke Estrada, male    DOB: 1985/08/12, 38 y.o.   MRN: [id number]  No chief complaint on file.   HPI Patient is in today for ***  ROS Per HPI      Objective:    There were no vitals taken for this visit.   Physical Exam  No results found for any visits on 04/25/23.      Assessment & Plan:   There are no diagnoses linked to this encounter.   No orders of the defined types were placed in this encounter.   No follow-ups on file.  Sherald Barge, FNP

## 2023-04-25 ENCOUNTER — Encounter: Payer: Self-pay | Admitting: Family Medicine

## 2023-04-25 ENCOUNTER — Ambulatory Visit (INDEPENDENT_AMBULATORY_CARE_PROVIDER_SITE_OTHER): Payer: Self-pay | Admitting: Family Medicine

## 2023-04-25 VITALS — BP 136/88 | HR 67 | Temp 98.7°F | Ht 71.0 in | Wt 166.2 lb

## 2023-04-25 DIAGNOSIS — R21 Rash and other nonspecific skin eruption: Secondary | ICD-10-CM | POA: Diagnosis not present

## 2023-04-25 DIAGNOSIS — B360 Pityriasis versicolor: Secondary | ICD-10-CM | POA: Diagnosis not present

## 2023-04-25 MED ORDER — CLOTRIMAZOLE-BETAMETHASONE 1-0.05 % EX CREA
1.0000 | TOPICAL_CREAM | Freq: Every day | CUTANEOUS | 0 refills | Status: DC
Start: 2023-04-25 — End: 2023-06-18

## 2023-04-25 MED ORDER — CLOTRIMAZOLE-BETAMETHASONE 1-0.05 % EX CREA: 1.0000 | TOPICAL_CREAM | Freq: Every day | CUTANEOUS | 0 refills | Status: DC

## 2023-04-28 ENCOUNTER — Ambulatory Visit (INDEPENDENT_AMBULATORY_CARE_PROVIDER_SITE_OTHER): Payer: 59 | Admitting: Behavioral Health

## 2023-04-28 ENCOUNTER — Encounter: Payer: Self-pay | Admitting: Behavioral Health

## 2023-04-28 DIAGNOSIS — F900 Attention-deficit hyperactivity disorder, predominantly inattentive type: Secondary | ICD-10-CM | POA: Diagnosis not present

## 2023-04-28 DIAGNOSIS — F411 Generalized anxiety disorder: Secondary | ICD-10-CM | POA: Diagnosis not present

## 2023-04-28 MED ORDER — JORNAY PM 60 MG PO CP24: ORAL_CAPSULE | ORAL | 0 refills | Status: DC

## 2023-04-28 NOTE — Progress Notes (Signed)
 [facility]  Patient ID: Luke Estrada,  MRN: [id number]  PCP: Etta Grandchild, MD  Date of Evaluation: 04/28/2023 Time spent:30 minutes  Chief Complaint:  Chief Complaint   ADHD; Medication Refill; Follow-up; Patient Education      HISTORY/CURRENT STATUS: HPI  38 year old male presents to this office for follow up and medication refill. Very happy with [facility] Charter this visit. Requesting no med changes. He is dieting and working out.  Traveling to [place] for vacation. Will be making sure it is legal to transport there.  Follows up with PCP regularly. Request reduction or change of medication. Anxiety 1/10 and depression is 0/10. Sleeping 7-8 hours per night. No mania, or psychosis. No SI/HI.    Past Psychiatric Medication Failures: Adderall Ritalin       Individual Medical History/ Review of Systems: Changes? :No   Allergies: Patient has no known allergies.  Current Medications:  Current Outpatient Medications:    amLODipine-olmesartan (AZOR) 5-20 MG tablet, Take 1 tablet by mouth daily., Disp: 90 tablet, Rfl: 0   clotrimazole-betamethasone (LOTRISONE) cream, Apply 1 Application topically daily., Disp: 45 g, Rfl: 0   dorzolamide-timolol (COSOPT) 2-0.5 % ophthalmic solution, Place 1 drop into both eyes., Disp: , Rfl:    fluconazole (DIFLUCAN) 150 MG tablet, Take 1 tablet (150 mg total) by mouth once a week. (Patient not taking: Reported on 04/25/2023), Disp: 3 tablet, Rfl: 0   indapamide (LOZOL) 1.25 MG tablet, Take 1 tablet (1.25 mg total) by mouth daily., Disp: 90 tablet, Rfl: 0   Methylphenidate HCl ER, PM, (JORNAY PM) 60 MG CP24, Take one capsule by mouth at 7-8 pm nightly., Disp: 30 capsule, Rfl: 0   Methylphenidate HCl ER, PM, (JORNAY PM) 60 MG CP24, Take one capsule by mouth at 7-8 pm nightly., Disp: 30 capsule, Rfl: 0   [START ON 05/02/2023] Methylphenidate HCl ER, PM, (JORNAY PM) 60 MG CP24, Take one capsule by mouth at 7-8 pm nightly., Disp: 30 capsule, Rfl: 0    triamcinolone cream (KENALOG) 0.1 %, Apply 1 Application topically 2 (two) times daily. (Patient not taking: Reported on 04/25/2023), Disp: 30 g, Rfl: 0 Medication Side Effects: none  Family Medical/ Social History: Changes? No  MENTAL HEALTH EXAM:  There were no vitals taken for this visit.There is no height or weight on file to calculate BMI.  General Appearance: Casual and Well Groomed  Eye Contact:  NA  Speech:  Clear and Coherent  Volume:  Normal  Mood:  NA  Affect:  Appropriate  Thought Process:  Coherent  Orientation:  Full (Time, Place, and Person)  Thought Content: Logical   Suicidal Thoughts:  No  Homicidal Thoughts:  No  Memory:  WNL  Judgement:  Good  Insight:  Good  Psychomotor Activity:  Negative  Concentration:  Concentration: Good  Recall:  Good  Fund of Knowledge: Good  Language: Good  Assets:  Desire for Improvement  ADL's:  Intact  Cognition: WNL  Prognosis:  Good    DIAGNOSES:    ICD-10-CM   1. Attention deficit hyperactivity disorder (ADHD), predominantly inattentive type  F90.0 Methylphenidate HCl ER, PM, (JORNAY PM) 60 MG CP24    2. Generalized anxiety disorder  F41.1       Receiving Psychotherapy: No    RECOMMENDATIONS:    Greater than 50% of  30 min face to face time with patient was spent on counseling and coordination of care. We reviewed his medications. He is doing very well on [place] and  no medication adjustments indicated.   To continue Jornay to 60  mg daily 7-8 pm  Will report worsening condition prompty To follow up with PCP for Cholesterol and heart health regularly Will monitor BP and HR on regular basis Will follow up in 12 weeks to reassess  Discussed potential benefits, risks, and side effects of stimulants with patient to include increased heart rate, palpitations, insomnia, increased anxiety, increased irritability, or decreased appetite.  Instructed patient to contact office if experiencing any significant tolerability  issues.  Reviewed PDMP    Joan Flores, NP

## 2023-05-03 ENCOUNTER — Telehealth: Payer: Self-pay

## 2023-05-03 NOTE — Telephone Encounter (Signed)
 Prior Authorization submitted for [facility] ER 60 mg with [employer], [id number]. JORNAY PM CAP 60MG  ER is approved through 05/02/2024.  Previously tried Adderall, Ritalin, Vyvanse

## 2023-06-03 ENCOUNTER — Ambulatory Visit
Admission: RE | Admit: 2023-06-03 | Discharge: 2023-06-03 | Disposition: A | Discharge status: Home / Self Care | Source: Ambulatory Visit | Attending: Internal Medicine | Admitting: Internal Medicine

## 2023-06-03 ENCOUNTER — Ambulatory Visit: Payer: Self-pay | Admitting: Internal Medicine

## 2023-06-03 ENCOUNTER — Ambulatory Visit: Payer: Managed Care, Other (non HMO) | Admitting: Internal Medicine

## 2023-06-03 DIAGNOSIS — I10 Essential (primary) hypertension: Secondary | ICD-10-CM

## 2023-06-12 DIAGNOSIS — H401331 Pigmentary glaucoma, bilateral, mild stage: Secondary | ICD-10-CM | POA: Diagnosis not present

## 2023-06-18 ENCOUNTER — Encounter: Payer: Self-pay | Admitting: Internal Medicine

## 2023-06-18 ENCOUNTER — Ambulatory Visit (INDEPENDENT_AMBULATORY_CARE_PROVIDER_SITE_OTHER): Admitting: Internal Medicine

## 2023-06-18 DIAGNOSIS — I10 Essential (primary) hypertension: Secondary | ICD-10-CM

## 2023-06-18 MED ORDER — INDAPAMIDE 1.25 MG PO TABS: 1.2500 mg | ORAL_TABLET | Freq: Every day | ORAL | 1 refills | Status: AC

## 2023-06-18 NOTE — Patient Instructions (Signed)
 Hypertension, Adult High blood pressure (hypertension) is when the force of blood pumping through the arteries is too strong. The arteries are the blood vessels that carry blood from the heart throughout the body. Hypertension forces the heart to work harder to pump blood and may cause arteries to become narrow or stiff. Untreated or uncontrolled hypertension can lead to a heart attack, heart failure, a stroke, kidney disease, and other problems. A blood pressure reading consists of a higher number over a lower number. Ideally, your blood pressure should be below 120/80. The first ("top") number is called the systolic pressure. It is a measure of the pressure in your arteries as your heart beats. The second ("bottom") number is called the diastolic pressure. It is a measure of the pressure in your arteries as the heart relaxes. What are the causes? The exact cause of this condition is not known. There are some conditions that result in high blood pressure. What increases the risk? Certain factors may make you more likely to develop high blood pressure. Some of these risk factors are under your control, including: Smoking. Not getting enough exercise or physical activity. Being overweight. Having too much fat, sugar, calories, or salt (sodium) in your diet. Drinking too much alcohol. Other risk factors include: Having a personal history of heart disease, diabetes, high cholesterol, or kidney disease. Stress. Having a family history of high blood pressure and high cholesterol. Having obstructive sleep apnea. Age. The risk increases with age. What are the signs or symptoms? High blood pressure may not cause symptoms. Very high blood pressure (hypertensive crisis) may cause: Headache. Fast or irregular heartbeats (palpitations). Shortness of breath. Nosebleed. Nausea and vomiting. Vision changes. Severe chest pain, dizziness, and seizures. How is this diagnosed? This condition is diagnosed by  measuring your blood pressure while you are seated, with your arm resting on a flat surface, your legs uncrossed, and your feet flat on the floor. The cuff of the blood pressure monitor will be placed directly against the skin of your upper arm at the level of your heart. Blood pressure should be measured at least twice using the same arm. Certain conditions can cause a difference in blood pressure between your right and left arms. If you have a high blood pressure reading during one visit or you have normal blood pressure with other risk factors, you may be asked to: Return on a different day to have your blood pressure checked again. Monitor your blood pressure at home for 1 week or longer. If you are diagnosed with hypertension, you may have other blood or imaging tests to help your health care provider understand your overall risk for other conditions. How is this treated? This condition is treated by making healthy lifestyle changes, such as eating healthy foods, exercising more, and reducing your alcohol intake. You may be referred for counseling on a healthy diet and physical activity. Your health care provider may prescribe medicine if lifestyle changes are not enough to get your blood pressure under control and if: Your systolic blood pressure is above 130. Your diastolic blood pressure is above 80. Your personal target blood pressure may vary depending on your medical conditions, your age, and other factors. Follow these instructions at home: Eating and drinking  Eat a diet that is high in fiber and potassium, and low in sodium, added sugar, and fat. An example of this eating plan is called the DASH diet. DASH stands for Dietary Approaches to Stop Hypertension. To eat this way: Eat  plenty of fresh fruits and vegetables. Try to fill one half of your plate at each meal with fruits and vegetables. Eat whole grains, such as whole-wheat pasta, brown rice, or whole-grain bread. Fill about one  fourth of your plate with whole grains. Eat or drink low-fat dairy products, such as skim milk or low-fat yogurt. Avoid fatty cuts of meat, processed or cured meats, and poultry with skin. Fill about one fourth of your plate with lean proteins, such as fish, chicken without skin, beans, eggs, or tofu. Avoid pre-made and processed foods. These tend to be higher in sodium, added sugar, and fat. Reduce your daily sodium intake. Many people with hypertension should eat less than 1,500 mg of sodium a day. Do not drink alcohol if: Your health care provider tells you not to drink. You are pregnant, may be pregnant, or are planning to become pregnant. If you drink alcohol: Limit how much you have to: 0-1 drink a day for women. 0-2 drinks a day for men. Know how much alcohol is in your drink. In the [place] one drink equals one 12 oz bottle of beer (355 mL), one 5 oz glass of wine (148 mL), or one 1 oz glass of hard liquor (44 mL). Lifestyle  Work with your health care provider to maintain a healthy body weight or to lose weight. Ask what an ideal weight is for you. Get at least 30 minutes of exercise that causes your heart to beat faster (aerobic exercise) most days of the week. Activities may include walking, swimming, or biking. Include exercise to strengthen your muscles (resistance exercise), such as Pilates or lifting weights, as part of your weekly exercise routine. Try to do these types of exercises for 30 minutes at least 3 days a week. Do not use any products that contain nicotine or tobacco. These products include cigarettes, chewing tobacco, and vaping devices, such as e-cigarettes. If you need help quitting, ask your health care provider. Monitor your blood pressure at home as told by your health care provider. Keep all follow-up visits. This is important. Medicines Take over-the-counter and prescription medicines only as told by your health care provider. Follow directions carefully. Blood  pressure medicines must be taken as prescribed. Do not skip doses of blood pressure medicine. Doing this puts you at risk for problems and can make the medicine less effective. Ask your health care provider about side effects or reactions to medicines that you should watch for. Contact a health care provider if you: Think you are having a reaction to a medicine you are taking. Have headaches that keep coming back (recurring). Feel dizzy. Have swelling in your ankles. Have trouble with your vision. Get help right away if you: Develop a severe headache or confusion. Have unusual weakness or numbness. Feel faint. Have severe pain in your chest or abdomen. Vomit repeatedly. Have trouble breathing. These symptoms may be an emergency. Get help right away. Call 911. Do not wait to see if the symptoms will go away. Do not drive yourself to the hospital. Summary Hypertension is when the force of blood pumping through your arteries is too strong. If this condition is not controlled, it may put you at risk for serious complications. Your personal target blood pressure may vary depending on your medical conditions, your age, and other factors. For most people, a normal blood pressure is less than 120/80. Hypertension is treated with lifestyle changes, medicines, or a combination of both. Lifestyle changes include losing weight, eating a healthy,  low-sodium diet, exercising more, and limiting alcohol. This information is not intended to replace advice given to you by your health care provider. Make sure you discuss any questions you have with your health care provider. Document Revised: 11/14/2020 Document Reviewed: 11/14/2020 Elsevier Patient Education  2024 [employer].

## 2023-06-18 NOTE — Progress Notes (Signed)
 Subjective:  Patient ID: Luke Estrada, male    DOB: 02-Nov-1985  Age: 38 y.o. MRN: [id number]  CC: Hypertension   HPI Luke Estrada presents for f/up ----  Discussed the use of AI scribe software for clinical note transcription with the patient, who gave verbal consent to proceed.  History of Present Illness   Luke Estrada is a 38 year old male who presents with concerns about elevated blood pressure readings.  He has been monitoring his blood pressure at home, typically recording readings around 132/87 mmHg, which he considers satisfactory. However, he notes that his blood pressure tends to be higher during medical visits, attributing this to 'white coat hypertension'. No symptoms of high blood pressure such as headache, blurred vision, chest pain, or shortness of breath.  He is not currently on any antihypertensive medications and is hesitant to start them. He manages his blood pressure through dietary measures, including consuming fruits and vegetables and limiting alcohol intake to three drinks in the past week. He does not use any other stimulants or medications that could elevate his blood pressure, except for methylphenidate , which he takes for ADHD.  He was diagnosed with ADHD about ten years ago following job instability. He has intermittently stopped taking methylphenidate  but finds it challenging to complete work tasks without it. 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       Outpatient Medications Prior to Visit  Medication Sig Dispense Refill   dorzolamide-timolol (COSOPT) 2-0.5 % ophthalmic solution Place 1 drop into both eyes.     Methylphenidate  HCl ER, PM, (JORNAY PM ) 60 MG CP24 Take one capsule by mouth at 7-8 pm nightly. 30 capsule 0   amLODipine -olmesartan  (AZOR ) 5-20 MG tablet Take 1 tablet by mouth daily. (Patient not taking: Reported on 06/18/2023) 90 tablet 0   clotrimazole -betamethasone   (LOTRISONE ) cream Apply 1 Application topically daily. 45 g 0   fluconazole  (DIFLUCAN ) 150 MG tablet Take 1 tablet (150 mg total) by mouth once a week. 3 tablet 0   indapamide  (LOZOL ) 1.25 MG tablet Take 1 tablet (1.25 mg total) by mouth daily. (Patient not taking: Reported on 06/18/2023) 90 tablet 0   Methylphenidate  HCl ER, PM, (JORNAY PM ) 60 MG CP24 Take one capsule by mouth at 7-8 pm nightly. 30 capsule 0   Methylphenidate  HCl ER, PM, (JORNAY PM ) 60 MG CP24 Take one capsule by mouth at 7-8 pm nightly. 30 capsule 0   triamcinolone  cream (KENALOG ) 0.1 % Apply 1 Application topically 2 (two) times daily. 30 g 0   No facility-administered medications prior to visit.    ROS Review of Systems  Constitutional: Negative.  Negative for appetite change, diaphoresis, fatigue and unexpected weight change.  HENT: Negative.    Eyes:  Negative for visual disturbance.  Respiratory:  Negative for cough, chest tightness, shortness of breath and wheezing.   Cardiovascular:  Negative for chest pain, palpitations and leg swelling.  Gastrointestinal:  Negative for abdominal pain, diarrhea, nausea and vomiting.  Endocrine: Negative.   Genitourinary: Negative.  Negative for difficulty urinating.  Musculoskeletal: Negative.  Negative for arthralgias and myalgias.  Skin: Negative.   Neurological:  Negative for dizziness, weakness, light-headedness and headaches.  Hematological:  Negative for adenopathy. Does not bruise/bleed easily.  Psychiatric/Behavioral:  Positive for decreased concentration. Negative for confusion, self-injury and sleep disturbance. The patient is not nervous/anxious.     Objective:  BP (!) 150/92 (BP Location:  Left Arm, Patient Position: Sitting, Cuff Size: Normal)   Pulse 81   Temp 97.6 F (36.4 C) (Oral)   Resp 16   Ht 5\' 11"  (1.803 m)   Wt 168 lb (76.2 kg)   SpO2 99%   BMI 23.43 kg/m   BP Readings from Last 3 Encounters:  06/18/23 (!) 150/92  04/25/23 136/88  03/06/23 (!)  162/116    Wt Readings from Last 3 Encounters:  06/18/23 168 lb (76.2 kg)  04/25/23 166 lb 3.2 oz (75.4 kg)  03/06/23 172 lb 4 oz (78.1 kg)    Physical Exam Vitals reviewed.  Constitutional:      Appearance: Normal appearance.  HENT:     Mouth/Throat:     Mouth: Mucous membranes are moist.  Eyes:     General: No scleral icterus.    Conjunctiva/sclera: Conjunctivae normal.  Cardiovascular:     Rate and Rhythm: Normal rate and regular rhythm.     Heart sounds: No murmur heard.    No friction rub. No gallop.  Pulmonary:     Effort: Pulmonary effort is normal.     Breath sounds: No stridor. No wheezing, rhonchi or rales.  Abdominal:     General: Abdomen is flat.     Palpations: There is no mass.     Tenderness: There is no abdominal tenderness. There is no guarding.     Hernia: No hernia is present.  Musculoskeletal:        General: Normal range of motion.     Cervical back: Neck supple.     Right lower leg: No edema.     Left lower leg: No edema.  Lymphadenopathy:     Cervical: No cervical adenopathy.  Skin:    General: Skin is warm and dry.  Neurological:     General: No focal deficit present.     Mental Status: He is alert. Mental status is at baseline.  Psychiatric:        Mood and Affect: Mood normal.        Behavior: Behavior normal.     Lab Results  Component Value Date   WBC 6.7 03/06/2023   HGB 16.6 03/06/2023   HCT 49.0 03/06/2023   PLT 273.0 03/06/2023   GLUCOSE 101 (H) 03/06/2023   CHOL 188 03/06/2023   TRIG 148.0 03/06/2023   HDL 35.40 (L) 03/06/2023   LDLCALC 123 (H) 03/06/2023   ALT 19 03/06/2023   AST 23 03/06/2023   NA 142 03/06/2023   K 4.9 03/06/2023   CL 102 03/06/2023   CREATININE 1.07 03/06/2023   BUN 11 03/06/2023   CO2 30 03/06/2023   TSH 1.17 03/06/2023   INR 1.1 (H) 03/16/2020   HGBA1C 5.3 10/12/2012    US  Renal Result Date: 06/03/2023 CLINICAL DATA:  Hypertension. EXAM: RENAL / URINARY TRACT ULTRASOUND COMPLETE  COMPARISON:  Limited abdomen ultrasound January 10, 2017 FINDINGS: Right Kidney: Renal measurements: 11.2 x 4.9 x 5 cm = volume: 143 mL. Echogenicity within normal limits. No mass or hydronephrosis visualized. Left Kidney: Renal measurements: 12.4 x 6 x 4.9 cm = volume: 190 mL. Echogenicity within normal limits. No mass or hydronephrosis visualized. Bladder: Appears normal for degree of bladder distention. Other: None. IMPRESSION: Normal renal ultrasound. Electronically Signed   By: Anna Barnes M.D.   On: 06/03/2023 16:54    Assessment & Plan:   Hypertension, unspecified type- In-office BP is higher than home BP. This may be white coat HTN but even BP's at home  are mildly elevated. I encouraged him to start lozol  and to continue monitoring his BP. -     Indapamide ; Take 1 tablet (1.25 mg total) by mouth daily.  Dispense: 90 tablet; Refill: 1     Follow-up: Return in about 6 months (around 12/19/2023).  Sandra Crouch, MD

## 2023-06-30 ENCOUNTER — Other Ambulatory Visit: Payer: Self-pay | Admitting: Behavioral Health

## 2023-06-30 DIAGNOSIS — F900 Attention-deficit hyperactivity disorder, predominantly inattentive type: Secondary | ICD-10-CM

## 2023-07-29 ENCOUNTER — Other Ambulatory Visit: Payer: Self-pay | Admitting: Behavioral Health

## 2023-07-29 DIAGNOSIS — F900 Attention-deficit hyperactivity disorder, predominantly inattentive type: Secondary | ICD-10-CM

## 2023-10-23 ENCOUNTER — Other Ambulatory Visit: Payer: Self-pay | Admitting: Behavioral Health

## 2023-10-23 DIAGNOSIS — F900 Attention-deficit hyperactivity disorder, predominantly inattentive type: Secondary | ICD-10-CM

## 2023-10-28 ENCOUNTER — Ambulatory Visit: Admitting: Behavioral Health

## 2023-10-28 ENCOUNTER — Encounter: Payer: Self-pay | Admitting: Behavioral Health

## 2023-10-28 DIAGNOSIS — F900 Attention-deficit hyperactivity disorder, predominantly inattentive type: Secondary | ICD-10-CM | POA: Diagnosis not present

## 2023-10-28 DIAGNOSIS — F411 Generalized anxiety disorder: Secondary | ICD-10-CM | POA: Diagnosis not present

## 2023-10-28 DIAGNOSIS — F4322 Adjustment disorder with anxiety: Secondary | ICD-10-CM

## 2023-10-28 MED ORDER — JORNAY PM 80 MG PO CP24: ORAL_CAPSULE | ORAL | 0 refills | Status: DC

## 2023-10-28 NOTE — Progress Notes (Signed)
 [facility]  Patient ID: SHERRIL HEYWARD,  MRN: [id number]  PCP: Joshua Debby CROME, MD  Date of Evaluation: 10/28/2023 Time spent:30 minutes  Chief Complaint:  Chief Complaint   ADHD; Anxiety; Follow-up; Medication Refill; Patient Education     HISTORY/CURRENT STATUS: HPI 38 year old male presents to this office for follow up and medication refill. Some anxiety and sadness. Got fired from job two weeks ago. He believes that it is because he gets in two big of a hurry and does not pay attention to detail. To seek out therapy.  Open to increasing his [place] this visit.  Follows up with PCP regularly. Request reduction or change of medication. Anxiety 1/10 and depression is 0/10. Sleeping 7-8 hours per night. No mania, or psychosis. No SI/HI.    Past Psychiatric Medication Failures: Adderall Ritalin    Individual Medical History/ Review of Systems: Changes? :No   Allergies: Patient has no known allergies.  Current Medications:  Current Outpatient Medications:    amLODipine -olmesartan  (AZOR ) 5-20 MG tablet, Take 1 tablet by mouth daily. (Patient not taking: Reported on 06/18/2023), Disp: 90 tablet, Rfl: 0   dorzolamide-timolol (COSOPT) 2-0.5 % ophthalmic solution, Place 1 drop into both eyes., Disp: , Rfl:    indapamide  (LOZOL ) 1.25 MG tablet, Take 1 tablet (1.25 mg total) by mouth daily., Disp: 90 tablet, Rfl: 1   JORNAY PM  60 MG CP24, Take one capsule by mouth at 7-8 pm nightly., Disp: 30 capsule, Rfl: 0   JORNAY PM  60 MG CP24, Take one capsule by mouth at 7-8 pm nightly., Disp: 30 capsule, Rfl: 0   Methylphenidate  HCl ER, PM, (JORNAY PM ) 60 MG CP24, Take one capsule by mouth at 7-8 pm nightly., Disp: 30 capsule, Rfl: 0 Medication Side Effects: none  Family Medical/ Social History: Changes? No  MENTAL HEALTH EXAM:  There were no vitals taken for this visit.There is no height or weight on file to calculate BMI.  General Appearance: Casual, Neat, and Well Groomed  Eye  Contact:  Good  Speech:  Clear and Coherent and Pressured  Volume:  Normal  Mood:  NA  Affect:  Appropriate  Thought Process:  Coherent  Orientation:  Full (Time, Place, and Person)  Thought Content: Logical   Suicidal Thoughts:  No  Homicidal Thoughts:  No  Memory:  WNL  Judgement:  Good  Insight:  Good  Psychomotor Activity:  Normal  Concentration:  Concentration: Good  Recall:  Good  Fund of Knowledge: Good  Language: Good  Assets:  Desire for Improvement  ADL's:  Intact  Cognition: WNL  Prognosis:  Good    DIAGNOSES:    ICD-10-CM   1. Attention deficit hyperactivity disorder (ADHD), predominantly inattentive type  F90.0     2. Generalized anxiety disorder  F41.1     3. Adjustment disorder with anxiety  F43.22       Receiving Psychotherapy: No    RECOMMENDATIONS:  Greater than 50% of  30 min face to face time with patient was spent on counseling and coordination of care. We reviewed his medications and agreed to increase with his [place]. We talked about his concerns with getting fired from jobs to to no paying attention to detail. Recommended he seek therapy with emphasis with ADHD.     Increase Jornay to 80  mg daily 7-8 pm  Will report worsening condition prompty To follow up with PCP for Cholesterol and heart health regularly Will monitor BP and HR on regular basis Will  follow up in 12 weeks to reassess  Discussed potential benefits, risks, and side effects of stimulants with patient to include increased heart rate, palpitations, insomnia, increased anxiety, increased irritability, or decreased appetite.  Instructed patient to contact office if experiencing any significant tolerability issues.  Reviewed PDMP    [place] DELENA Pizza, NP

## 2023-12-01 DIAGNOSIS — R41840 Attention and concentration deficit: Secondary | ICD-10-CM | POA: Diagnosis not present

## 2023-12-01 DIAGNOSIS — Z79899 Other long term (current) drug therapy: Secondary | ICD-10-CM | POA: Diagnosis not present

## 2023-12-04 DIAGNOSIS — R41840 Attention and concentration deficit: Secondary | ICD-10-CM | POA: Diagnosis not present

## 2023-12-04 DIAGNOSIS — Z79899 Other long term (current) drug therapy: Secondary | ICD-10-CM | POA: Diagnosis not present

## 2023-12-10 ENCOUNTER — Ambulatory Visit: Admitting: Physician Assistant

## 2023-12-23 ENCOUNTER — Other Ambulatory Visit: Payer: Self-pay | Admitting: Behavioral Health

## 2023-12-23 DIAGNOSIS — F900 Attention-deficit hyperactivity disorder, predominantly inattentive type: Secondary | ICD-10-CM

## 2023-12-24 NOTE — Telephone Encounter (Signed)
 Sent to [place] to approve.

## 2023-12-24 NOTE — Telephone Encounter (Signed)
 Pt called about his Journay rf. It was sent to [facility] but since he's out today I told pt I would send back to the nurse to see if someone else can send it,

## 2024-01-28 ENCOUNTER — Ambulatory Visit
Admission: RE | Admit: 2024-01-28 | Discharge: 2024-01-28 | Disposition: A | Discharge status: Home / Self Care | Payer: Self-pay | Source: Ambulatory Visit | Attending: Family Medicine | Admitting: Family Medicine

## 2024-01-28 VITALS — BP 155/101 | HR 74 | Temp 98.2°F | Resp 16

## 2024-01-28 DIAGNOSIS — M79645 Pain in left finger(s): Secondary | ICD-10-CM

## 2024-01-28 DIAGNOSIS — S61211A Laceration without foreign body of left index finger without damage to nail, initial encounter: Secondary | ICD-10-CM

## 2024-01-28 MED ORDER — MUPIROCIN 2 % EX OINT: 1.0000 | TOPICAL_OINTMENT | Freq: Three times a day (TID) | CUTANEOUS | 0 refills | Status: AC

## 2024-01-28 NOTE — ED Provider Notes (Signed)
 " [facility]  Note:  This document was prepared using Conservation officer, historic buildings and may include unintentional dictation errors.  MRN: [id number] DOB: 22-May-1985  Subjective:   Luke Estrada is a 39 y.o. male presenting for 1 day history of left index finger pain.  Patient suffered a laceration from putting his finger inside of a food processor.  Cleansed wound last night with peroxide and applied antibiotic ointment today.  He did wash it today as well.  Pain is mild to moderate.  Still had some bleeding today.  Tetanus vaccine is up-to-date.  Current Outpatient Medications  Medication Instructions   amLODipine -olmesartan  (AZOR ) 5-20 MG tablet 1 tablet, Oral, Daily   dorzolamide-timolol (COSOPT) 2-0.5 % ophthalmic solution 1 drop   indapamide  (LOZOL ) 1.25 mg, Oral, Daily   JORNAY PM  60 MG CP24 Take one capsule by mouth at 7-8 pm nightly.   JORNAY PM  60 MG CP24 Take one capsule by mouth at 7-8 pm nightly.   JORNAY PM  80 MG CP24 Take one capsule by mouth between 7-8 pm daily   Methylphenidate  HCl ER, PM, (JORNAY PM ) 60 MG CP24 Take one capsule by mouth at 7-8 pm nightly.   Methylphenidate  HCl ER, PM, (JORNAY PM ) 80 MG CP24 Take one capsule by mouth between 7-8 pm daily   [START ON 02/17/2024] Methylphenidate  HCl ER, PM, (JORNAY PM ) 80 MG CP24 Take one capsule by mouth between 7-8 pm daily    Allergies[1]  Past Medical History:  Diagnosis Date   ADHD (attention deficit hyperactivity disorder)    Glaucoma    Hypertension      Past Surgical History:  Procedure Laterality Date   APPENDECTOMY  2000    Family History  Problem Relation Age of Onset   Hypertension Mother    Hypercholesterolemia Mother    Hypertension Father    Celiac disease Brother     Social History   Occupational History   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    Appearance: Normal appearance. He is well-developed and normal weight. He is not ill-appearing, toxic-appearing or diaphoretic.  HENT:     Head: Normocephalic and atraumatic.     Right Ear: External ear normal.     Left Ear: External ear normal.     Nose: Nose normal.     Mouth/Throat:     Pharynx: Oropharynx is clear.  Eyes:     General: No scleral icterus.       Right eye: No discharge.        Left eye: No discharge.     Extraocular Movements: Extraocular movements intact.  Cardiovascular:     Rate and Rhythm: Normal rate.  Pulmonary:     Effort:  Pulmonary effort is normal.  Musculoskeletal:       Hands:     Cervical back: Normal range of motion.  Neurological:     Mental Status: He is alert and oriented to person, place, and time.  Psychiatric:        Mood and Affect: Mood normal.        Behavior: Behavior normal.        Thought Content: Thought content normal.        Judgment: Judgment normal.    Wound cleansed thoroughly.  Bacitracin applied, secured with nonadherent and Coban.   Assessment and Plan :   PDMP not reviewed this encounter.  1. Finger pain, left   2. Laceration of left index finger without foreign body without damage to nail, initial encounter      Wound must heal by secondary intention, it is not amenable to laceration repair both due to the timeframe of his injury as well as the injury itself.  Wound care reviewed.  Return to clinic precautions reviewed.    [1] No Known Allergies     Christopher Savannah, [place] 01/28/24 1649  "

## 2024-01-28 NOTE — Discharge Instructions (Signed)
 Change your dressing 3-5 times daily. Every time you change your dressing, clean the wound gently with warm water and Dial antibacterial soap. Pat the wound dry, let it breathe for roughly an hour before covering it back up. When you reapply a dressing, apply Bactroban  ointment to the wound, then cover with non-stick/non-adherent gauze.

## 2024-01-28 NOTE — ED Triage Notes (Signed)
 Pt presents for evaluation of laceration of (R) index finger yesterday. Pt reports that finger is red and swollen, still slowly bleeding. Pt rinsed laceration with peroxide and applied antibiotic ointment today. Finger is still in pain.

## 2024-02-16 ENCOUNTER — Telehealth: Payer: Self-pay | Admitting: Behavioral Health

## 2024-02-16 NOTE — Telephone Encounter (Signed)
 Pt is out of town and needs rf of [place] 80mg     [facility] [address]  [facility] [phone]

## 2024-02-16 NOTE — Telephone Encounter (Signed)
 LF 12/31, due 1/28

## 2024-02-18 ENCOUNTER — Telehealth: Payer: Self-pay

## 2024-02-18 NOTE — Telephone Encounter (Signed)
 Called [facility] to cancel RF before pending to other pharmacy and patient picked up today at [facility].

## 2024-02-19 NOTE — Telephone Encounter (Signed)
 Prior Authorization Jornay 80 mg #30/30 [employer]  Approved  Effective Date: 01/19/2024 Authorization Expiration Date: 02/17/2025

## 2024-03-01 ENCOUNTER — Ambulatory Visit: Admitting: Behavioral Health
